# Patient Record
Sex: Female | Born: 1970 | Race: White | Hispanic: No | Smoking: Never smoker
Health system: Southern US, Community
[De-identification: ages and names within clinical notes are randomized; demographics above are authoritative.]

## PROBLEM LIST (undated history)

## (undated) DIAGNOSIS — E782 Mixed hyperlipidemia: Secondary | ICD-10-CM

## (undated) DIAGNOSIS — N939 Abnormal uterine and vaginal bleeding, unspecified: Principal | ICD-10-CM

## (undated) DIAGNOSIS — N9089 Other specified noninflammatory disorders of vulva and perineum: Secondary | ICD-10-CM

## (undated) DIAGNOSIS — IMO0002 Reserved for concepts with insufficient information to code with codable children: Secondary | ICD-10-CM

## (undated) DIAGNOSIS — N943 Premenstrual tension syndrome: Secondary | ICD-10-CM

## (undated) DIAGNOSIS — N949 Unspecified condition associated with female genital organs and menstrual cycle: Principal | ICD-10-CM

## (undated) DIAGNOSIS — N83202 Unspecified ovarian cyst, left side: Secondary | ICD-10-CM

## (undated) DIAGNOSIS — B009 Herpesviral infection, unspecified: Secondary | ICD-10-CM

## (undated) DIAGNOSIS — D649 Anemia, unspecified: Secondary | ICD-10-CM

## (undated) DIAGNOSIS — R87619 Unspecified abnormal cytological findings in specimens from cervix uteri: Secondary | ICD-10-CM

## (undated) DIAGNOSIS — M069 Rheumatoid arthritis, unspecified: Secondary | ICD-10-CM

## (undated) DIAGNOSIS — N840 Polyp of corpus uteri: Secondary | ICD-10-CM

## (undated) DIAGNOSIS — I10 Essential (primary) hypertension: Secondary | ICD-10-CM

## (undated) DIAGNOSIS — F439 Reaction to severe stress, unspecified: Secondary | ICD-10-CM

## (undated) DIAGNOSIS — G4700 Insomnia, unspecified: Secondary | ICD-10-CM

## (undated) DIAGNOSIS — R1032 Left lower quadrant pain: Secondary | ICD-10-CM

## (undated) DIAGNOSIS — R87629 Unspecified abnormal cytological findings in specimens from vagina: Secondary | ICD-10-CM

## (undated) HISTORY — DX: Left lower quadrant pain: R10.32

## (undated) HISTORY — DX: Unspecified abnormal cytological findings in specimens from cervix uteri: R87.619

## (undated) HISTORY — DX: Other specified noninflammatory disorders of vulva and perineum: N90.89

## (undated) HISTORY — DX: Abnormal uterine and vaginal bleeding, unspecified: N93.9

## (undated) HISTORY — DX: Herpesviral infection, unspecified: B00.9

## (undated) HISTORY — DX: Unspecified abnormal cytological findings in specimens from vagina: R87.629

## (undated) HISTORY — DX: Unspecified condition associated with female genital organs and menstrual cycle: N94.9

## (undated) HISTORY — DX: Essential (primary) hypertension: I10

## (undated) HISTORY — DX: Insomnia, unspecified: G47.00

## (undated) HISTORY — PX: TUBAL LIGATION: SHX77

## (undated) HISTORY — DX: Polyp of corpus uteri: N84.0

## (undated) HISTORY — DX: Mixed hyperlipidemia: E78.2

## (undated) HISTORY — DX: Reserved for concepts with insufficient information to code with codable children: IMO0002

## (undated) HISTORY — DX: Premenstrual tension syndrome: N94.3

## (undated) HISTORY — DX: Unspecified ovarian cyst, left side: N83.202

## (undated) HISTORY — DX: Rheumatoid arthritis, unspecified: M06.9

## (undated) HISTORY — DX: Anemia, unspecified: D64.9

## (undated) HISTORY — DX: Reaction to severe stress, unspecified: F43.9

---

## 2004-11-03 ENCOUNTER — Ambulatory Visit (HOSPITAL_COMMUNITY): Admission: RE | Admit: 2004-11-03 | Discharge: 2004-11-03 | Payer: Self-pay | Admitting: Family Medicine

## 2013-01-28 ENCOUNTER — Ambulatory Visit (INDEPENDENT_AMBULATORY_CARE_PROVIDER_SITE_OTHER): Payer: BC Managed Care – PPO | Admitting: Adult Health

## 2013-01-28 ENCOUNTER — Encounter: Payer: Self-pay | Admitting: Adult Health

## 2013-01-28 VITALS — BP 128/78 | Ht 64.0 in | Wt 152.0 lb

## 2013-01-28 DIAGNOSIS — N9089 Other specified noninflammatory disorders of vulva and perineum: Secondary | ICD-10-CM

## 2013-01-28 HISTORY — DX: Other specified noninflammatory disorders of vulva and perineum: N90.89

## 2013-01-28 NOTE — Patient Instructions (Signed)
Follow up in 10 days for pap and physical and labs fasting

## 2013-01-28 NOTE — Progress Notes (Signed)
Subjective:     Patient ID: Becky Henry, female   DOB: 10/02/1970, 42 y.o.   MRN: [id number]  HPI Faline is a 42 year old white female in complaining of an area left labia that has been there about a week and is painful at times, no known injury has had unprotected sex.Last pap about 8 years ago.  Review of Systems See HPI Reviewed past medical,surgical, social and family history. Reviewed medications and allergies.     Objective:   Physical Exam BP 128/78  Ht 5\' 4"  (1.626 m)  Wt 152 lb (68.947 kg)  BMI 26.08 kg/m2  LMP 01/24/2013   Skin warm and dry. External genitalia is normal in appearance, except for a linear area left labia, is red and the skin is broken, HSV culture obtained with Q tip and area tender when rubbed with Q tip, explained may not have enough secretions to culture but can check blood antibodies for HSV 1&2.  Assessment:     Labial lesion r/o HSV    Plan:     Follow up in 10 days for pap and physical and fasting labs

## 2013-01-28 NOTE — Assessment & Plan Note (Signed)
Has linear line left labia will culture HSV and check blood for antibodies

## 2013-01-29 ENCOUNTER — Telehealth: Payer: Self-pay | Admitting: Adult Health

## 2013-01-29 LAB — HSV 2 ANTIBODY, IGG: HSV 2 Glycoprotein G Ab, IgG: 5.62 IV — ABNORMAL HIGH

## 2013-01-29 NOTE — Telephone Encounter (Signed)
Left message to call.

## 2013-01-30 LAB — HERPES SIMPLEX VIRUS CULTURE: Organism ID, Bacteria: DETECTED

## 2013-02-03 ENCOUNTER — Telehealth: Payer: Self-pay | Admitting: Adult Health

## 2013-02-03 ENCOUNTER — Telehealth: Payer: Self-pay | Admitting: Obstetrics and Gynecology

## 2013-02-03 MED ORDER — VALACYCLOVIR HCL 1 G PO TABS: ORAL_TABLET | ORAL | Status: DC

## 2013-02-03 NOTE — Telephone Encounter (Signed)
Busy, has appt 10/24 if unable to reach will talk then

## 2013-02-03 NOTE — Telephone Encounter (Signed)
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 Pt to keep her appt this Friday.

## 2013-02-04 ENCOUNTER — Telehealth: Payer: Self-pay

## 2013-02-04 NOTE — Telephone Encounter (Signed)
No answer

## 2013-02-07 ENCOUNTER — Other Ambulatory Visit (HOSPITAL_COMMUNITY)
Admission: RE | Admit: 2013-02-07 | Discharge: 2013-02-07 | Disposition: A | Discharge status: Home / Self Care | Payer: BC Managed Care – PPO | Source: Ambulatory Visit | Attending: Adult Health | Admitting: Adult Health

## 2013-02-07 ENCOUNTER — Ambulatory Visit (INDEPENDENT_AMBULATORY_CARE_PROVIDER_SITE_OTHER): Payer: BC Managed Care – PPO | Admitting: Adult Health

## 2013-02-07 ENCOUNTER — Encounter: Payer: Self-pay | Admitting: Adult Health

## 2013-02-07 ENCOUNTER — Other Ambulatory Visit: Payer: BC Managed Care – PPO

## 2013-02-07 VITALS — BP 134/80 | HR 80 | Ht 64.0 in | Wt 152.0 lb

## 2013-02-07 DIAGNOSIS — Z0000 Encounter for general adult medical examination without abnormal findings: Secondary | ICD-10-CM

## 2013-02-07 DIAGNOSIS — Z1329 Encounter for screening for other suspected endocrine disorder: Secondary | ICD-10-CM

## 2013-02-07 DIAGNOSIS — Z13220 Encounter for screening for lipoid disorders: Secondary | ICD-10-CM

## 2013-02-07 DIAGNOSIS — Z01419 Encounter for gynecological examination (general) (routine) without abnormal findings: Secondary | ICD-10-CM | POA: Insufficient documentation

## 2013-02-07 DIAGNOSIS — Z113 Encounter for screening for infections with a predominantly sexual mode of transmission: Secondary | ICD-10-CM

## 2013-02-07 DIAGNOSIS — Z1151 Encounter for screening for human papillomavirus (HPV): Secondary | ICD-10-CM | POA: Insufficient documentation

## 2013-02-07 DIAGNOSIS — Z1212 Encounter for screening for malignant neoplasm of rectum: Secondary | ICD-10-CM

## 2013-02-07 LAB — COMPREHENSIVE METABOLIC PANEL
ALT: 9 U/L (ref 0–35)
Chloride: 102 mEq/L (ref 96–112)
Creat: 0.84 mg/dL (ref 0.50–1.10)
Glucose, Bld: 92 mg/dL (ref 70–99)
Total Protein: 7 g/dL (ref 6.0–8.3)

## 2013-02-07 LAB — CBC
HCT: 38.6 % (ref 36.0–46.0)
MCH: 28.8 pg (ref 26.0–34.0)
MCHC: 33.7 g/dL (ref 30.0–36.0)
MCV: 85.6 fL (ref 78.0–100.0)
Platelets: 242 10*3/uL (ref 150–400)
RDW: 14.2 % (ref 11.5–15.5)
WBC: 5.4 10*3/uL (ref 4.0–10.5)

## 2013-02-07 LAB — LIPID PANEL
Cholesterol: 159 mg/dL (ref 0–200)
LDL Cholesterol: 84 mg/dL (ref 0–99)
Triglycerides: 108 mg/dL (ref <150)

## 2013-02-07 LAB — TSH: TSH: 0.745 u[IU]/mL (ref 0.350–4.500)

## 2013-02-07 LAB — HEMOCCULT GUIAC POC 1CARD (OFFICE)

## 2013-02-07 NOTE — Progress Notes (Signed)
Patient ID: Becky Henry, female   DOB: 1971-03-10, 42 y.o.   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   Current Medications, Allergies, Past Medical History, Past Surgical History, Family History and Social History were reviewed in [employer] record.     Review of Systems: Patient denies any headaches, blurred vision, shortness of breath, chest pain, abdominal pain, problems with bowel movements, urination, or intercourse. No joint pain or mood swings, periods are regular.Area on labia resolved.    Physical Exam:BP 134/80  Pulse 80  Ht 5\' 4"  (1.626 m)  Wt 152 lb (68.947 kg)  BMI 26.08 kg/m2  LMP 01/24/2013 General:  Well developed, well nourished, no acute distress Skin:  Warm and dry, tan Neck:  Midline trachea, normal thyroid Lungs; Clear to auscultation bilaterally Breast:  No dominant palpable mass, retraction, or nipple discharge Cardiovascular: Regular rate and rhythm Abdomen:  Soft, non tender, no hepatosplenomegaly Pelvic:  External genitalia is normal in appearance.Has clit ring.  The vagina is normal in appearance. The cervix is bulbous and friable with EC brush, pap performed with HPV and a GC/CHL was obtained.  Uterus is felt to be normal size, shape, and contour.  No  adnexal masses or tenderness noted. Rectal: Good sphincter tone, no polyps, or hemorrhoids felt.  Hemoccult negative. Extremities:  No swelling or varicosities noted Psych:  Alert and cooperative, a little teary with HSV discussion, transmission and shedding discussed.   Impression: Yearly exam STD screening Recent HSV diagnosis    Plan: Physical in 1 year Mammogram yearly  Had fasting labs this before visit(CBC,CMP,TSH and lipids) Will talk next week Review handouts on herpes

## 2013-02-07 NOTE — Patient Instructions (Signed)
Herpes Labialis You have a fever blister or cold sore (herpes labialis). These painful, grouped sores are caused by one of the herpes viruses (HSV1 most commonly). They are usually found around the lips and mouth, but the same infection can also affect other areas on the face such as the nose and eyes. Herpes infections take about 10 days to heal. They often occur again and again in the same spot. Other symptoms may include numbness and tingling in the involved skin, achiness, fever, and swollen glands in the neck. Colds, emotional stress, injuries, or excess sunlight exposure all seem to make herpes reappear. Herpes lip infections are contagious. Direct contact with these sores can spread the infection. It can also be spread to other parts of your own body. TREATMENT  Herpes labialis is usually self-limited and resolves within 1 week. To reduce pain and swelling, apply ice packs frequently to the sores or suck on popsicles or frozen juice bars. Antiviral medicine may be used by mouth to shorten the duration of the breakout. Avoid spreading the infection by washing your hands often. Be careful not to touch your eyes or genital areas after handling the infected blisters. Do not kiss or have other intimate contact with others. After the blisters are completely healed you may resume contact. Use sunscreen to lessen recurrences.  If this is your first infection with herpes, or if you have a severe or repeated infections, your caregiver may prescribe one of the anti-viral drugs to speed up the healing. If you have sun-related flare-ups despite the use of sunscreen, starting oral anti-viral medicine before a prolonged exposure (going skiing or to the beach) can prevent most episodes.  SEEK IMMEDIATE MEDICAL CARE IF:  You develop a headache, sleepiness, high fever, vomiting, or severe weakness.  You have eye irritation, pain, blurred vision or redness.  You develop a prolonged infection not getting better in 10  days. Document Released: 04/03/2005 Document Revised: 06/26/2011 Document Reviewed: 02/05/2009 [facility] Patient Information 2014 [place], [place]. Physical in 1 year Mammogram yearly Talk next week about  labs

## 2013-02-07 NOTE — Assessment & Plan Note (Signed)
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

## 2013-02-10 ENCOUNTER — Telehealth: Payer: Self-pay | Admitting: Adult Health

## 2013-02-10 NOTE — Telephone Encounter (Signed)
busy

## 2013-02-20 ENCOUNTER — Telehealth: Payer: Self-pay | Admitting: Adult Health

## 2013-02-20 ENCOUNTER — Other Ambulatory Visit: Payer: Self-pay

## 2013-02-20 NOTE — Telephone Encounter (Signed)
Pt informed of WNL results from 02/07/2013 including Pap, GC/CHL, TSH. Lipid profile, CMP, CBC.

## 2013-03-04 ENCOUNTER — Telehealth: Payer: Self-pay | Admitting: Adult Health

## 2013-03-05 MED ORDER — VALACYCLOVIR HCL 1 G PO TABS: ORAL_TABLET | ORAL | Status: DC

## 2013-03-05 NOTE — Telephone Encounter (Signed)
Will rx valtrex 1gm 1 daily

## 2013-03-20 ENCOUNTER — Telehealth: Payer: Self-pay | Admitting: Adult Health

## 2013-03-20 NOTE — Telephone Encounter (Signed)
Pt states needs RX for valtrex called to pharmacy. Called RX for valtrex 1000 mg 1 po tablet daily, PRN refill per written order by Cyril Mourning, NP. Pt aware.

## 2013-08-26 ENCOUNTER — Encounter: Payer: Self-pay | Admitting: Adult Health

## 2013-08-26 ENCOUNTER — Other Ambulatory Visit (INDEPENDENT_AMBULATORY_CARE_PROVIDER_SITE_OTHER): Payer: BC Managed Care – PPO

## 2013-08-26 ENCOUNTER — Other Ambulatory Visit: Payer: Self-pay | Admitting: Adult Health

## 2013-08-26 ENCOUNTER — Ambulatory Visit (INDEPENDENT_AMBULATORY_CARE_PROVIDER_SITE_OTHER): Payer: BC Managed Care – PPO | Admitting: Adult Health

## 2013-08-26 VITALS — BP 140/90 | Ht 63.0 in | Wt 142.4 lb

## 2013-08-26 DIAGNOSIS — N83209 Unspecified ovarian cyst, unspecified side: Secondary | ICD-10-CM

## 2013-08-26 DIAGNOSIS — N949 Unspecified condition associated with female genital organs and menstrual cycle: Secondary | ICD-10-CM

## 2013-08-26 DIAGNOSIS — Z3202 Encounter for pregnancy test, result negative: Secondary | ICD-10-CM

## 2013-08-26 DIAGNOSIS — N898 Other specified noninflammatory disorders of vagina: Secondary | ICD-10-CM

## 2013-08-26 DIAGNOSIS — N840 Polyp of corpus uteri: Secondary | ICD-10-CM

## 2013-08-26 DIAGNOSIS — N83202 Unspecified ovarian cyst, left side: Secondary | ICD-10-CM

## 2013-08-26 HISTORY — DX: Polyp of corpus uteri: N84.0

## 2013-08-26 HISTORY — DX: Unspecified condition associated with female genital organs and menstrual cycle: N94.9

## 2013-08-26 HISTORY — DX: Unspecified ovarian cyst, left side: N83.202

## 2013-08-26 LAB — POCT URINALYSIS DIPSTICK
LEUKOCYTES UA: NEGATIVE
Nitrite, UA: NEGATIVE
Protein, UA: NEGATIVE

## 2013-08-26 LAB — POCT WET PREP (WET MOUNT)
TRICHOMONAS WET PREP HPF POC: NEGATIVE
WBC, Wet Prep HPF POC: POSITIVE

## 2013-08-26 LAB — POCT URINE PREGNANCY: Preg Test, Ur: NEGATIVE

## 2013-08-26 MED ORDER — CIPROFLOXACIN HCL 500 MG PO TABS: 500.0000 mg | ORAL_TABLET | Freq: Two times a day (BID) | ORAL | Status: DC

## 2013-08-26 MED ORDER — DOXYCYCLINE MONOHYDRATE 100 MG PO CAPS: 100.0000 mg | ORAL_CAPSULE | Freq: Two times a day (BID) | ORAL | Status: DC

## 2013-08-26 MED ORDER — PROMETHAZINE HCL 25 MG PO TABS: 25.0000 mg | ORAL_TABLET | Freq: Four times a day (QID) | ORAL | Status: DC | PRN

## 2013-08-26 NOTE — Progress Notes (Addendum)
Subjective:     Patient ID: Becky Henry, female   DOB: [birth year], 43 y.o.   MRN: [id number]  HPI Becky Henry is a 43 year old white female in complaining of having had fever Saturday then nausea and vomiting, was seen Monday at Urgent Care in [place], [place].And found to have hemorrhagic ovarian cyst, and was given pain meds only.She had pain after BM and had to lay in floor, due to pain in left side.  Review of Systems See HPI Reviewed past medical,surgical, social and family history. Reviewed medications and allergies.     Objective:   Physical Exam BP 140/90  Ht _0  (1.6 m)  Wt 142 lb 6.4 oz (64.592 kg)  BMI 25.23 kg/m2  LMP 04/15/2015Urine trace blood, UPT negative,she appears to be uncomfortable.   Skin warm and dry.Pelvic: external genitalia is normal in appearance, vagina: scant discharge without odor, cervix:smooth and bulbous,negative CMT, uterus: normal size, shape and contour,  tender, no masses felt, adnexa: no masses, has tenderness LLQ and down left leg, No CVAT, or rebound,but looks uncomfortable.US obtained.US showed endometrial polyp, and left ovarian cyst and left para tubal cyst.Discussed with Dr Elonda Husky and he agrees with pal of treatment.Discussed  With pt. The need for CT to assess bowels and kidneys. GC/CHL obtained.   Assessment:     Pelvic pain Left ovarian cyst Endometrial polyp    Plan:    Check CBC,CMP,ESR and GC/CHL Rx Cipro 500 mg #20 1 bid x 10 days Rx doxycyline 100 mg #20 1 bid x 10 days Rx Phenergan 25 mg #30 1 every 6 hours prn with 1 refill Take hydrocodone if needed CT abd and pelvis with and without contrast 5/13 at 5:30 pm at [facility] If pain increases go to ER  Review handout on pelvic pain Follow here with me in 48 hours   Pt has note to return to work 09/01/13.

## 2013-08-26 NOTE — Patient Instructions (Addendum)
[facility] at [facility]30 pm Follow up here in 48 hours Pelvic Pain, Female Female pelvic pain can be caused by many different things and start from a variety of places. Pelvic pain refers to pain that is located in the lower half of the abdomen and between your hips. The pain may occur over a short period of time (acute) or may be reoccurring (chronic). The cause of pelvic pain may be related to disorders affecting the female reproductive organs (gynecologic), but it may also be related to the bladder, kidney stones, an intestinal complication, or muscle or skeletal problems. Getting help right away for pelvic pain is important, especially if there has been severe, sharp, or a sudden onset of unusual pain. It is also important to get help right away because some types of pelvic pain can be life threatening.  CAUSES  Below are only some of the causes of pelvic pain. The causes of pelvic pain can be in one of several categories.   Gynecologic.  Pelvic inflammatory disease.  Sexually transmitted infection.  Ovarian cyst or a twisted ovarian ligament (ovarian torsion).  Uterine lining that grows outside the uterus (endometriosis).  Fibroids, cysts, or tumors.  Ovulation.  Pregnancy.  Pregnancy that occurs outside the uterus (ectopic pregnancy).  Miscarriage.  Labor.  Abruption of the placenta or ruptured uterus.  Infection.  Uterine infection (endometritis).  Bladder infection.  Diverticulitis.  Miscarriage related to a uterine infection (septic abortion).  Bladder.  Inflammation of the bladder (cystitis).  Kidney stone(s).  Gastrointenstinal.  Constipation.  Diverticulitis.  Neurologic.  Trauma.  Feeling pelvic pain because of mental or emotional causes (psychosomatic).  Cancers of the bowel or pelvis. EVALUATION  Your caregiver will want to take a careful history of your concerns. This includes recent changes in your health,  a careful gynecologic history of your periods (menses), and a sexual history. Obtaining your family history and medical history is also important. Your caregiver may suggest a pelvic exam. A pelvic exam will help identify the location and severity of the pain. It also helps in the evaluation of which organ system may be involved. In order to identify the cause of the pelvic pain and be properly treated, your caregiver may order tests. These tests may include:   A pregnancy test.  Pelvic ultrasonography.  An X-ray exam of the abdomen.  A urinalysis or evaluation of vaginal discharge.  Blood tests. HOME CARE INSTRUCTIONS   Only take over-the-counter or prescription medicines for pain, discomfort, or fever as directed by your caregiver.   Rest as directed by your caregiver.   Eat a balanced diet.   Drink enough fluids to make your urine clear or pale yellow, or as directed.   Avoid sexual intercourse if it causes pain.   Apply warm or cold compresses to the lower abdomen depending on which one helps the pain.   Avoid stressful situations.   Keep a journal of your pelvic pain. Write down when it started, where the pain is located, and if there are things that seem to be associated with the pain, such as food or your menstrual cycle.  Follow up with your caregiver as directed.  SEEK MEDICAL CARE IF:  Your medicine does not help your pain.  You have abnormal vaginal discharge. SEEK IMMEDIATE MEDICAL CARE IF:   You have heavy bleeding from the vagina.   Your pelvic pain increases.   You feel lightheaded or faint.  You have chills.   You have pain with urination or blood in your urine.   You have uncontrolled diarrhea or vomiting.   You have a fever or persistent symptoms for more than 3 days.  You have a fever and your symptoms suddenly get worse.   You are being physically or sexually abused.  MAKE SURE YOU:  Understand these instructions.  Will  watch your condition.  Will get help if you are not doing well or get worse. Document Released: 02/29/2004 Document Revised: 10/03/2011 Document Reviewed: 07/24/2011 [facility] Patient Information 2014 [place], [place].

## 2013-08-27 ENCOUNTER — Ambulatory Visit (HOSPITAL_COMMUNITY)
Admission: RE | Admit: 2013-08-27 | Discharge: 2013-08-27 | Disposition: A | Discharge status: Home / Self Care | Payer: BC Managed Care – PPO | Source: Ambulatory Visit | Attending: Adult Health | Admitting: Adult Health

## 2013-08-27 DIAGNOSIS — R109 Unspecified abdominal pain: Secondary | ICD-10-CM | POA: Insufficient documentation

## 2013-08-27 DIAGNOSIS — N949 Unspecified condition associated with female genital organs and menstrual cycle: Secondary | ICD-10-CM

## 2013-08-27 DIAGNOSIS — R110 Nausea: Secondary | ICD-10-CM | POA: Insufficient documentation

## 2013-08-27 LAB — CBC
HCT: 41.8 % (ref 36.0–46.0)
HEMOGLOBIN: 14.7 g/dL (ref 12.0–15.0)
MCH: 31.1 pg (ref 26.0–34.0)
MCHC: 35.2 g/dL (ref 30.0–36.0)
MCV: 88.6 fL (ref 78.0–100.0)
Platelets: 215 10*3/uL (ref 150–400)
RBC: 4.72 MIL/uL (ref 3.87–5.11)
RDW: 14 % (ref 11.5–15.5)
WBC: 5.3 10*3/uL (ref 4.0–10.5)

## 2013-08-27 LAB — COMPREHENSIVE METABOLIC PANEL
ALBUMIN: 4.6 g/dL (ref 3.5–5.2)
ALK PHOS: 66 U/L (ref 39–117)
ALT: 25 U/L (ref 0–35)
AST: 25 U/L (ref 0–37)
BUN: 9 mg/dL (ref 6–23)
CHLORIDE: 100 meq/L (ref 96–112)
CO2: 21 meq/L (ref 19–32)
CREATININE: 0.76 mg/dL (ref 0.50–1.10)
Calcium: 9.3 mg/dL (ref 8.4–10.5)
GLUCOSE: 77 mg/dL (ref 70–99)
POTASSIUM: 3.4 meq/L — AB (ref 3.5–5.3)
Sodium: 133 mEq/L — ABNORMAL LOW (ref 135–145)
Total Bilirubin: 0.5 mg/dL (ref 0.2–1.2)
Total Protein: 7.4 g/dL (ref 6.0–8.3)

## 2013-08-27 LAB — GC/CHLAMYDIA PROBE AMP
CT Probe RNA: NEGATIVE
GC Probe RNA: NEGATIVE

## 2013-08-27 LAB — SEDIMENTATION RATE: Sed Rate: 17 mm/hr (ref 0–22)

## 2013-08-27 MED ORDER — IOHEXOL 300 MG/ML  SOLN
100.0000 mL | Freq: Once | INTRAMUSCULAR | Status: AC | PRN
  Administered 2013-08-27: 100 mL via INTRAVENOUS

## 2013-08-28 ENCOUNTER — Ambulatory Visit: Payer: BC Managed Care – PPO | Admitting: Adult Health

## 2013-08-29 ENCOUNTER — Ambulatory Visit (INDEPENDENT_AMBULATORY_CARE_PROVIDER_SITE_OTHER): Payer: BC Managed Care – PPO | Admitting: Adult Health

## 2013-08-29 ENCOUNTER — Encounter: Payer: Self-pay | Admitting: Adult Health

## 2013-08-29 VITALS — BP 124/80 | Ht 63.0 in | Wt 148.5 lb

## 2013-08-29 DIAGNOSIS — N83202 Unspecified ovarian cyst, left side: Secondary | ICD-10-CM

## 2013-08-29 DIAGNOSIS — N949 Unspecified condition associated with female genital organs and menstrual cycle: Secondary | ICD-10-CM

## 2013-08-29 DIAGNOSIS — N83209 Unspecified ovarian cyst, unspecified side: Secondary | ICD-10-CM

## 2013-08-29 DIAGNOSIS — N840 Polyp of corpus uteri: Secondary | ICD-10-CM

## 2013-08-29 NOTE — Progress Notes (Signed)
Subjective:     Patient ID: Becky Henry, female   DOB: 27-May-1970, 43 y.o.   MRN: [id number]  HPI Becky Henry is a 43 year old white female back in follow up of pelvic pain, which is much better.  Review of Systems See HPI Reviewed past medical,surgical, social and family history. Reviewed medications and allergies.     Objective:   Physical Exam BP 124/80  Ht 5' 3"  (1.6 m)  Wt 148 lb 8 oz (67.359 kg)  BMI 26.31 kg/m2  LMP 08/04/2013   Feels much better, reviewed labs and CT with pt.CT was normal except some thickening of colon ?inflammation.ESR 17,GC/CHL negative, CBC WNL. Will get F/U US after next period to assess ovary and endo polyp.  Assessment:     Pelvic pain resolving, ? Bowel related Left ovarian cyst Endometrial polyp    Plan:     Finish medications Return in 3 weeks for [place] and see me

## 2013-08-29 NOTE — Patient Instructions (Signed)
Follow up in 3 weeks for [place]

## 2013-09-16 ENCOUNTER — Other Ambulatory Visit: Payer: Self-pay | Admitting: Adult Health

## 2013-09-16 DIAGNOSIS — N840 Polyp of corpus uteri: Secondary | ICD-10-CM

## 2013-09-16 DIAGNOSIS — N949 Unspecified condition associated with female genital organs and menstrual cycle: Secondary | ICD-10-CM

## 2013-09-22 ENCOUNTER — Ambulatory Visit: Payer: BC Managed Care – PPO | Admitting: Adult Health

## 2013-09-22 ENCOUNTER — Other Ambulatory Visit: Payer: BC Managed Care – PPO

## 2013-09-25 ENCOUNTER — Ambulatory Visit (INDEPENDENT_AMBULATORY_CARE_PROVIDER_SITE_OTHER): Payer: BC Managed Care – PPO

## 2013-09-25 ENCOUNTER — Encounter: Payer: Self-pay | Admitting: Adult Health

## 2013-09-25 ENCOUNTER — Ambulatory Visit (INDEPENDENT_AMBULATORY_CARE_PROVIDER_SITE_OTHER): Payer: BC Managed Care – PPO | Admitting: Adult Health

## 2013-09-25 VITALS — BP 128/74 | Ht 63.0 in | Wt 148.0 lb

## 2013-09-25 DIAGNOSIS — N840 Polyp of corpus uteri: Secondary | ICD-10-CM

## 2013-09-25 DIAGNOSIS — N949 Unspecified condition associated with female genital organs and menstrual cycle: Secondary | ICD-10-CM

## 2013-09-25 DIAGNOSIS — G4700 Insomnia, unspecified: Secondary | ICD-10-CM

## 2013-09-25 HISTORY — DX: Insomnia, unspecified: G47.00

## 2013-09-25 MED ORDER — LORAZEPAM 0.5 MG PO TABS: ORAL_TABLET | ORAL | Status: DC

## 2013-09-25 NOTE — Progress Notes (Signed)
Subjective:     Patient ID: CASIA PLA, female   DOB: [birth year], 43 y.o.   MRN: [id number]  HPI Kailer is a 43 year old white female in for [place] to assess uterus and ovaries, has history of ovarian cyst and polyp, and had episode of pelvic pain, but it was better after finished medication.Complains of not sleeping well, has tired several OTC meds with out success.  Review of Systems See HPI Reviewed past medical,surgical, social and family history. Reviewed medications and allergies.     Objective:   Physical Exam BP 128/74  Ht 5\' 3"  (1.6 m)  Wt 148 lb (67.132 kg)  BMI 26.22 kg/m2  LMP 06/04/2015Reviewed [place] with pt.    Uterus Anteverted no myometrial masses noted  Endometrium 10.73mm hyperechoic mass noted within cavity with +Doppler flow noted (polyp)  Right ovary Ovary appears WNL  Left ovary Ovary appears WNL  No free fluid noted within the pelvis  Technician Comments:  Anteverted uterus noted with 10.2mm endometrial ?polyp noted within, bilateral ovaries appear WNL  Discussed removing polyp and ?abaltion to stop periods   Assessment:     Endometrial polyp Insomnia     Plan:    Review handout on ablation and insomnia Rx ativan 0.5mg  #15 1 at hs prn not every night Return in 2 weeks for pre op with Dr Despina Hidden for hysteroscopy D&C with polyp removal and endo ablation

## 2013-09-25 NOTE — Patient Instructions (Signed)

## 2013-10-10 ENCOUNTER — Encounter: Payer: Self-pay | Admitting: Obstetrics & Gynecology

## 2013-10-10 ENCOUNTER — Ambulatory Visit (INDEPENDENT_AMBULATORY_CARE_PROVIDER_SITE_OTHER): Payer: BC Managed Care – PPO | Admitting: Obstetrics & Gynecology

## 2013-10-10 VITALS — BP 120/80 | Ht 64.0 in | Wt 148.0 lb

## 2013-10-10 DIAGNOSIS — N840 Polyp of corpus uteri: Secondary | ICD-10-CM

## 2013-10-10 DIAGNOSIS — Z01818 Encounter for other preprocedural examination: Secondary | ICD-10-CM

## 2013-10-10 MED ORDER — LORAZEPAM 0.5 MG PO TABS: ORAL_TABLET | ORAL | Status: DC

## 2013-10-10 NOTE — Progress Notes (Signed)
Patient ID: Becky NoonMarie E Goguen, female   DOB: 1970-06-21, 43 y.o.   MRN: [id number] Preoperative History and Physical  Becky Henry is a 43 y.o. [id number] with Patient's last menstrual period was 10/06/2013. admitted for a hysterosocpy uterine curettage endometrial ablation and removal of endometrial polyp.  Sonogram reveals rather large endometrial polyp  PMH:    Past Medical History  Diagnosis Date  . Labial lesion 01/28/2013  . Abnormal Pap smear   . Anemia   . Hypertension   . Herpes simplex without mention of complication   . Vaginal Pap smear, abnormal   . Unspecified symptom associated with female genital organs 08/26/2013  . Left ovarian cyst 08/26/2013  . Endometrial polyp 08/26/2013  . Insomnia 09/25/2013    PSH:     Past Surgical History  Procedure Laterality Date  . Tubal ligation      POb/GynH:      OB History   Grav Para Term Preterm Abortions TAB SAB Ect Mult Living   5 4   1  1   4       SH:   History  Substance Use Topics  . Smoking status: Never Smoker   . Smokeless tobacco: Never Used  . Alcohol Use: No     Comment: occ.    FH:    Family History  Problem Relation Age of Onset  . Diabetes Mother   . Mental illness Mother   . Diabetes Father   . Cancer Father   . Diabetes Maternal Grandmother   . Diabetes Maternal Grandfather   . Diabetes Paternal Grandmother   . Diabetes Paternal Grandfather      Allergies:  Allergies  Allergen Reactions  . Sulfa Antibiotics Hives and Swelling    Also affected liver.    Medications:      Current outpatient prescriptions:LORazepam (ATIVAN) 0.5 MG tablet, Take 1 at hs prn not every night, Disp: 30 tablet, Rfl: 2;  valACYclovir (VALTREX) 1000 MG tablet, Take 1 po daily, Disp: 30 tablet, Rfl: prn;  ciprofloxacin (CIPRO) 500 MG tablet, Take 1 tablet (500 mg total) by mouth 2 (two) times daily., Disp: 20 tablet, Rfl: 0 doxycycline (MONODOX) 100 MG capsule, Take 1 capsule (100 mg total) by mouth 2 (two) times daily.,  Disp: 20 capsule, Rfl: 0;  HYDROcodone-acetaminophen (NORCO/VICODIN) 5-325 MG per tablet, Take 1 tablet by mouth every 6 (six) hours as needed for moderate pain., Disp: , Rfl: ;  promethazine (PHENERGAN) 25 MG tablet, Take 1 tablet (25 mg total) by mouth every 6 (six) hours as needed for nausea or vomiting., Disp: 30 tablet, Rfl: 1  Review of Systems:   Review of Systems  Constitutional: Negative for fever, chills, weight loss, malaise/fatigue and diaphoresis.  HENT: Negative for hearing loss, ear pain, nosebleeds, congestion, sore throat, neck pain, tinnitus and ear discharge.   Eyes: Negative for blurred vision, double vision, photophobia, pain, discharge and redness.  Respiratory: Negative for cough, hemoptysis, sputum production, shortness of breath, wheezing and stridor.   Cardiovascular: Negative for chest pain, palpitations, orthopnea, claudication, leg swelling and PND.  Gastrointestinal: Positive for abdominal pain. Negative for heartburn, nausea, vomiting, diarrhea, constipation, blood in stool and melena.  Genitourinary: Negative for dysuria, urgency, frequency, hematuria and flank pain.  Musculoskeletal: Negative for myalgias, back pain, joint pain and falls.  Skin: Negative for itching and rash.  Neurological: Negative for dizziness, tingling, tremors, sensory change, speech change, focal weakness, seizures, loss of consciousness, weakness and headaches.  Endo/Heme/Allergies: Negative for environmental allergies  and polydipsia. Does not bruise/bleed easily.  Psychiatric/Behavioral: Negative for depression, suicidal ideas, hallucinations, memory loss and substance abuse. The patient is not nervous/anxious and does not have insomnia.      PHYSICAL EXAM:  Blood pressure 120/80, height 5\' 4"  (1.626 m), weight 148 lb (67.132 kg), last menstrual period 10/06/2013.    Vitals reviewed. Constitutional: She is oriented to person, place, and time. She appears well-developed and  well-nourished.  HENT:  Head: Normocephalic and atraumatic.  Right Ear: External ear normal.  Left Ear: External ear normal.  Nose: Nose normal.  Mouth/Throat: Oropharynx is clear and moist.  Eyes: Conjunctivae and EOM are normal. Pupils are equal, round, and reactive to light. Right eye exhibits no discharge. Left eye exhibits no discharge. No scleral icterus.  Neck: Normal range of motion. Neck supple. No tracheal deviation present. No thyromegaly present.  Cardiovascular: Normal rate, regular rhythm, normal heart sounds and intact distal pulses.  Exam reveals no gallop and no friction rub.   No murmur heard. Respiratory: Effort normal and breath sounds normal. No respiratory distress. She has no wheezes. She has no rales. She exhibits no tenderness.  GI: Soft. Bowel sounds are normal. She exhibits no distension and no mass. There is tenderness. There is no rebound and no guarding.  Genitourinary:       Vulva is normal without lesions Vagina is pink moist without discharge Cervix normal in appearance and pap is normal Uterus is normal Adnexa is negative with normal sized ovaries by sonogram  Musculoskeletal: Normal range of motion. She exhibits no edema and no tenderness.  Neurological: She is alert and oriented to person, place, and time. She has normal reflexes. She displays normal reflexes. No cranial nerve deficit. She exhibits normal muscle tone. Coordination normal.  Skin: Skin is warm and dry. No rash noted. No erythema. No pallor.  Psychiatric: She has a normal mood and affect. Her behavior is normal. Judgment and thought content normal.    Labs: No results found for this or any previous visit (from the past 336 hour(s)).  EKG: No orders found for this or any previous visit.  Imaging Studies: US Pelvis Limited  10/01/2013   GYNECOLOGIC SONOGRAM   Becky Henry is a 43 y.o. [id number] LMP 09/18/2013 for a pelvic sonogram for  follow up ?endometrial polyp per previous u/s.  Uterus                        Anteverted no myometrial masses noted   Endometrium          10.106mm hyperechoic mass noted within cavity with  +Doppler flow noted (polyp)  Right ovary             Ovary appears WNL  Left ovary                Ovary appears WNL  No free fluid noted within the pelvis  Technician Comments:  Anteverted uterus noted with 10.20mm endometrial ?polyp noted within,  bilateral ovaries appear WNL   Becky Henry 09/25/2013 3:40 PM Clinical Impression and recommendations:  I have reviewed the sonogram results above.  Combined with the patient's current clinical course, below are my  impressions and any appropriate recommendations for management based on  the sonographic findings:  1. Persistent presence of 10.6 mm endometrial polyp 2. Tissue sampling advised, by biopsy or probable hysteroscopic resection.  Becky Henry,Becky Henry       Assessment: Patient Active Problem List   Diagnosis  Date Noted  . Insomnia 09/25/2013  . Unspecified symptom associated with female genital organs 08/26/2013  . Left ovarian cyst 08/26/2013  . Endometrial polyp 08/26/2013  . Labial lesion 01/28/2013    Plan: Hysteroscopy uterine curettage endometrial ablation with removal of endometrial polyp  Becky Henry,Becky Henry 10/10/2013 12:40 PM

## 2013-10-20 NOTE — Patient Instructions (Signed)
Becky Henry  10/20/2013   Your procedure is scheduled on:  10/24/2013  Report to [facility] at  615  AM.  Call this number if you have problems the morning of surgery: [phone]   Remember:   Do not eat food or drink liquids after midnight.   Take these medicines the morning of surgery with A SIP OF WATER:  Hydrocodone, lorazepam, phenergan   Do not wear jewelry, make-up or nail polish.  Do not wear lotions, powders, or perfumes.   Do not shave 48 hours prior to surgery. Men may shave face and neck.  Do not bring valuables to the hospital.  [facility] is not responsible for any belongings or valuables.               Contacts, dentures or bridgework may not be worn into surgery.  Leave suitcase in the car. After surgery it may be brought to your room.  For patients admitted to the hospital, discharge time is determined by your treatment team.               Patients discharged the day of surgery will not be allowed to drive home.  Name and phone number of your driver: family  Special Instructions: Shower using CHG 2 nights before surgery and the night before surgery.  If you shower the day of surgery use CHG.  Use special wash - you have one bottle of CHG for all showers.  You should use approximately 1/3 of the bottle for each shower.   Please read over the following fact sheets that you were given: Pain Booklet, Coughing and Deep Breathing, Surgical Site Infection Prevention, Anesthesia Post-op Instructions and Care and Recovery After Surgery Endometrial Ablation Endometrial ablation removes the lining of the uterus (endometrium). It is usually a same-day, outpatient treatment. Ablation helps avoid major surgery, such as surgery to remove the cervix and uterus (hysterectomy). After endometrial ablation, you will have little or no menstrual bleeding and may not be able to have children. However, if you are premenopausal, you will need to use a reliable method of birth  control following the procedure because of the small chance that pregnancy can occur. There are different reasons to have this procedure, which include:  Heavy periods.  Bleeding that is causing anemia.  Irregular bleeding.  Bleeding fibroids on the lining inside the uterus if they are smaller than 3 centimeters. This procedure should not be done if:  You want children in the future.  You have severe cramps with your menstrual period.  You have precancerous or cancerous cells in your uterus.  You were recently pregnant.  You have gone through menopause.  You have had major surgery on the uterus, such as a cesarean delivery. LET [facility] PROVIDER KNOW ABOUT:  Any allergies you have.  All medicines you are taking, including vitamins, herbs, eye drops, creams, and over-the-counter medicines.  Previous problems you or members of your family have had with the use of anesthetics.  Any blood disorders you have.  Previous surgeries you have had.  Medical conditions you have. RISKS AND COMPLICATIONS  Generally, this is a safe procedure. However, as with any procedure, complications can occur. Possible complications include:  Perforation of the uterus.  Bleeding.  Infection of the uterus, bladder, or vagina.  Injury to surrounding organs.  An air bubble to the lung (air embolus).  Pregnancy following the procedure.  Failure of the procedure  to help the problem, requiring hysterectomy.  Decreased ability to diagnose cancer in the lining of the uterus. BEFORE THE PROCEDURE  The lining of the uterus must be tested to make sure there is no pre-cancerous or cancer cells present.  An ultrasound may be performed to look at the size of the uterus and to check for abnormalities.  Medicines may be given to thin the lining of the uterus. PROCEDURE  During the procedure, your health care provider will use a tool called a resectoscope to help see inside your uterus.  There are different ways to remove the lining of your uterus.   Radiofrequency - This method uses a radiofrequency-alternating electric current to remove the lining of the uterus.  Cryotherapy - This method uses extreme cold to freeze the lining of the uterus.  Heated-Free Liquid - This method uses heated salt (saline) solution to remove the lining of the uterus.  Microwave - This method uses high-energy microwaves to heat up the lining of the uterus to remove it.  Thermal balloon - This method involves inserting a catheter with a balloon tip into the uterus. The balloon tip is filled with heated fluid to remove the lining of the uterus. AFTER THE PROCEDURE  After your procedure, do not have sexual intercourse or insert anything into your vagina until permitted by your health care provider. After the procedure, you may experience:  Cramps.  Vaginal discharge.  Frequent urination. Document Released: 02/11/2004 Document Revised: 12/04/2012 Document Reviewed: 09/04/2012 [facility] Patient Information 2015 [employer] [place]. This information is not intended to replace advice given to you by your health care provider. Make sure you discuss any questions you have with your health care provider. Hysteroscopy Hysteroscopy is a procedure used for looking inside the womb (uterus). It may be done for various reasons, including:  To evaluate abnormal bleeding, fibroid (benign, noncancerous) tumors, polyps, scar tissue (adhesions), and possibly cancer of the uterus.  To look for lumps (tumors) and other uterine growths.  To look for causes of why a woman cannot get pregnant (infertility), causes of recurrent loss of pregnancy (miscarriages), or a lost intrauterine device (IUD).  To perform a sterilization by blocking the fallopian tubes from inside the uterus. In this procedure, a thin, flexible tube with a tiny light and camera on the end of it (hysteroscope) is used to look inside the uterus. A  hysteroscopy should be done right after a menstrual period to be sure you are not pregnant. LET [facility] KNOW ABOUT:   Any allergies you have.  All medicines you are taking, including vitamins, herbs, eye drops, creams, and over-the-counter medicines.  Previous problems you or members of your family have had with the use of anesthetics.  Any blood disorders you have.  Previous surgeries you have had.  Medical conditions you have. RISKS AND COMPLICATIONS  Generally, this is a safe procedure. However, as with any procedure, complications can occur. Possible complications include:  Putting a hole in the uterus.  Excessive bleeding.  Infection.  Damage to the cervix.  Injury to other organs.  Allergic reaction to medicines.  Too much fluid used in the uterus for the procedure. BEFORE THE PROCEDURE   Ask your health care provider about changing or stopping any regular medicines.  Do not take aspirin or blood thinners for 1 week before the procedure, or as directed by your health care provider. These can cause bleeding.  If you smoke, do not smoke for 2 weeks before the procedure.  In some cases, a medicine is placed in the cervix the day before the procedure. This medicine makes the cervix have a larger opening (dilate). This makes it easier for the instrument to be inserted into the uterus during the procedure.  Do not eat or drink anything for at least 8 hours before the surgery.  Arrange for someone to take you home after the procedure. PROCEDURE   You may be given a medicine to relax you (sedative). You may also be given one of the following:  A medicine that numbs the area around the cervix (local anesthetic).  A medicine that makes you sleep through the procedure (general anesthetic).  The hysteroscope is inserted through the vagina into the uterus. The camera on the hysteroscope sends a picture to a TV screen. This gives the surgeon a good view  inside the uterus.  During the procedure, air or a liquid is put into the uterus, which allows the surgeon to see better.  Sometimes, tissue is gently scraped from inside the uterus. These tissue samples are sent to a lab for testing. AFTER THE PROCEDURE   If you had a general anesthetic, you may be groggy for a couple hours after the procedure.  If you had a local anesthetic, you will be able to go home as soon as you are stable and feel ready.  You may have some cramping. This normally lasts for a couple days.  You may have bleeding, which varies from light spotting for a few days to menstrual-like bleeding for 3-7 days. This is normal.  If your test results are not back during the visit, make an appointment with your health care provider to find out the results. Document Released: 07/10/2000 Document Revised: 01/22/2013 Document Reviewed: 10/31/2012 [facility] Patient Information 2015 [place], [place]. This information is not intended to replace advice given to you by your health care provider. Make sure you discuss any questions you have with your health care provider. Dilation and Curettage or Vacuum Curettage Dilation and curettage (D&C) and vacuum curettage are minor procedures. A D&C involves stretching (dilation) the cervix and scraping (curettage) the inside lining of the womb (uterus). During a D&C, tissue is gently scraped from the inside lining of the uterus. During a vacuum curettage, the lining and tissue in the uterus are removed with the use of gentle suction.  Curettage may be performed to either diagnose or treat a problem. As a diagnostic procedure, curettage is performed to examine tissues from the uterus. A diagnostic curettage may be performed for the following symptoms:   Irregular bleeding in the uterus.   Bleeding with the development of clots.   Spotting between menstrual periods.   Prolonged menstrual periods.   Bleeding after menopause.   No menstrual  period (amenorrhea).   A change in size and shape of the uterus.  As a treatment procedure, curettage may be performed for the following reasons:   Removal of an IUD (intrauterine device).   Removal of retained placenta after giving birth. Retained placenta can cause an infection or bleeding severe enough to require transfusions.   Abortion.   Miscarriage.   Removal of polyps inside the uterus.   Removal of uncommon types of noncancerous lumps (fibroids).  LET [facility] CARE PROVIDER KNOW ABOUT:   Any allergies you have.   All medicines you are taking, including vitamins, herbs, eye drops, creams, and over-the-counter medicines.   Previous problems you or members of your family have had with the use of anesthetics.  Any blood disorders you have.   Previous surgeries you have had.   Medical conditions you have. RISKS AND COMPLICATIONS  Generally, this is a safe procedure. However, as with any procedure, complications can occur. Possible complications include:  Excessive bleeding.   Infection of the uterus.   Damage to the cervix.   Development of scar tissue (adhesions) inside the uterus, later causing abnormal amounts of menstrual bleeding.   Complications from the general anesthetic, if a general anesthetic is used.   Putting a hole (perforation) in the uterus. This is rare.  BEFORE THE PROCEDURE   Eat and drink before the procedure only as directed by your health care provider.   Arrange for someone to take you home.  PROCEDURE  This procedure usually takes about 15-30 minutes.  You will be given one of the following:  A medicine that numbs the area in and around the cervix (local anesthetic).   A medicine to make you sleep through the procedure (general anesthetic).  You will lie on your back with your legs in stirrups.   A warm metal or plastic instrument (speculum) will be placed in your vagina to keep it open and to allow the  health care provider to see the cervix.  There are two ways in which your cervix can be softened and dilated. These include:   Taking a medicine.   Having thin rods (laminaria) inserted into your cervix.   A curved tool (curette) will be used to scrape cells from the inside lining of the uterus. In some cases, gentle suction is applied with the curette. The curette will then be removed.  AFTER THE PROCEDURE   You will rest in the recovery area until you are stable and are ready to go home.   You may feel sick to your stomach (nauseous) or throw up (vomit) if you were given a general anesthetic.   You may have a sore throat if a tube was placed in your throat during general anesthesia.   You may have light cramping and bleeding. This may last for 2 days to 2 weeks after the procedure.   Your uterus needs to make a new lining after the procedure. This may make your next period late. Document Released: 04/03/2005 Document Revised: 12/04/2012 Document Reviewed: 10/31/2012 [facility] Patient Information 2015 [place], [place]. This information is not intended to replace advice given to you by your health care provider. Make sure you discuss any questions you have with your health care provider. PATIENT INSTRUCTIONS POST-ANESTHESIA  IMMEDIATELY FOLLOWING SURGERY:  Do not drive or operate machinery for the first twenty four hours after surgery.  Do not make any important decisions for twenty four hours after surgery or while taking narcotic pain medications or sedatives.  If you develop intractable nausea and vomiting or a severe headache please notify your doctor immediately.  FOLLOW-UP:  Please make an appointment with your surgeon as instructed. You do not need to follow up with anesthesia unless specifically instructed to do so.  WOUND CARE INSTRUCTIONS (if applicable):  Keep a dry clean dressing on the anesthesia/puncture wound site if there is drainage.  Once the wound has quit  draining you may leave it open to air.  Generally you should leave the bandage intact for twenty four hours unless there is drainage.  If the epidural site drains for more than 36-48 hours please call the anesthesia department.  QUESTIONS?:  Please feel free to call your physician or the hospital operator if you have  any questions, and they will be happy to assist you.

## 2013-10-21 ENCOUNTER — Other Ambulatory Visit: Payer: Self-pay | Admitting: Obstetrics & Gynecology

## 2013-10-21 ENCOUNTER — Encounter (HOSPITAL_COMMUNITY): Payer: Self-pay

## 2013-10-21 ENCOUNTER — Encounter (HOSPITAL_COMMUNITY)
Admission: RE | Admit: 2013-10-21 | Discharge: 2013-10-21 | Disposition: A | Discharge status: Home / Self Care | Payer: BC Managed Care – PPO | Source: Ambulatory Visit | Attending: Obstetrics & Gynecology | Admitting: Obstetrics & Gynecology

## 2013-10-21 LAB — COMPREHENSIVE METABOLIC PANEL
ALT: 10 U/L (ref 0–35)
AST: 12 U/L (ref 0–37)
Albumin: 3.7 g/dL (ref 3.5–5.2)
Alkaline Phosphatase: 48 U/L (ref 39–117)
Anion gap: 13 (ref 5–15)
BILIRUBIN TOTAL: 0.3 mg/dL (ref 0.3–1.2)
BUN: 8 mg/dL (ref 6–23)
CHLORIDE: 103 meq/L (ref 96–112)
CO2: 25 meq/L (ref 19–32)
Calcium: 9.7 mg/dL (ref 8.4–10.5)
Creatinine, Ser: 0.88 mg/dL (ref 0.50–1.10)
GFR calc Af Amer: >90 mL/min (ref >90)
GFR calc non Af Amer: 79 mL/min — ABNORMAL LOW (ref >90)
Glucose, Bld: 91 mg/dL (ref 70–99)
Potassium: 3.8 mEq/L (ref 3.7–5.3)
Sodium: 141 mEq/L (ref 137–147)
Total Protein: 6.7 g/dL (ref 6.0–8.3)

## 2013-10-21 LAB — CBC
HCT: 36 % (ref 36.0–46.0)
Hemoglobin: 12.2 g/dL (ref 12.0–15.0)
MCH: 31 pg (ref 26.0–34.0)
MCHC: 33.9 g/dL (ref 30.0–36.0)
MCV: 91.6 fL (ref 78.0–100.0)
PLATELETS: 241 10*3/uL (ref 150–400)
RBC: 3.93 MIL/uL (ref 3.87–5.11)
RDW: 12.9 % (ref 11.5–15.5)
WBC: 5.8 10*3/uL (ref 4.0–10.5)

## 2013-10-21 LAB — URINE MICROSCOPIC-ADD ON

## 2013-10-21 LAB — URINALYSIS, ROUTINE W REFLEX MICROSCOPIC
BILIRUBIN URINE: NEGATIVE
GLUCOSE, UA: NEGATIVE mg/dL
HGB URINE DIPSTICK: NEGATIVE
Ketones, ur: NEGATIVE mg/dL
Nitrite: NEGATIVE
PH: 6 (ref 5.0–8.0)
Protein, ur: NEGATIVE mg/dL
Specific Gravity, Urine: 1.02 (ref 1.005–1.030)
Urobilinogen, UA: 0.2 mg/dL (ref 0.0–1.0)

## 2013-10-21 LAB — HCG, QUANTITATIVE, PREGNANCY: hCG, Beta Chain, Quant, S: <1 m[IU]/mL (ref <5)

## 2013-10-21 MED ORDER — PHENYLEPHRINE HCL 2.5 % OP SOLN: 1.0000 [drp] | OPHTHALMIC | Status: DC

## 2013-10-21 MED ORDER — LIDOCAINE HCL 3.5 % OP GEL: 1.0000 "application " | Freq: Once | OPHTHALMIC | Status: DC

## 2013-10-21 MED ORDER — CYCLOPENTOLATE-PHENYLEPHRINE 0.2-1 % OP SOLN: 1.0000 [drp] | OPHTHALMIC | Status: DC

## 2013-10-21 MED ORDER — TETRACAINE HCL 0.5 % OP SOLN: 1.0000 [drp] | OPHTHALMIC | Status: DC

## 2013-10-24 ENCOUNTER — Ambulatory Visit (HOSPITAL_COMMUNITY)
Admission: RE | Admit: 2013-10-24 | Discharge: 2013-10-24 | Disposition: A | Discharge status: Home / Self Care | Payer: BC Managed Care – PPO | Source: Ambulatory Visit | Attending: Obstetrics & Gynecology | Admitting: Obstetrics & Gynecology

## 2013-10-24 ENCOUNTER — Encounter (HOSPITAL_COMMUNITY): Payer: BC Managed Care – PPO | Admitting: Anesthesiology

## 2013-10-24 ENCOUNTER — Encounter (HOSPITAL_COMMUNITY)
Admission: RE | Disposition: A | Discharge status: Home / Self Care | Payer: Self-pay | Source: Ambulatory Visit | Attending: Obstetrics & Gynecology

## 2013-10-24 ENCOUNTER — Ambulatory Visit (HOSPITAL_COMMUNITY): Payer: BC Managed Care – PPO | Admitting: Anesthesiology

## 2013-10-24 ENCOUNTER — Encounter (HOSPITAL_COMMUNITY): Payer: Self-pay | Admitting: *Deleted

## 2013-10-24 DIAGNOSIS — N840 Polyp of corpus uteri: Secondary | ICD-10-CM | POA: Insufficient documentation

## 2013-10-24 DIAGNOSIS — N920 Excessive and frequent menstruation with regular cycle: Secondary | ICD-10-CM

## 2013-10-24 DIAGNOSIS — N946 Dysmenorrhea, unspecified: Secondary | ICD-10-CM | POA: Insufficient documentation

## 2013-10-24 DIAGNOSIS — Z9889 Other specified postprocedural states: Secondary | ICD-10-CM

## 2013-10-24 DIAGNOSIS — D649 Anemia, unspecified: Secondary | ICD-10-CM | POA: Insufficient documentation

## 2013-10-24 DIAGNOSIS — Z79899 Other long term (current) drug therapy: Secondary | ICD-10-CM | POA: Insufficient documentation

## 2013-10-24 DIAGNOSIS — I10 Essential (primary) hypertension: Secondary | ICD-10-CM | POA: Insufficient documentation

## 2013-10-24 HISTORY — PX: DILITATION & CURRETTAGE/HYSTROSCOPY WITH THERMACHOICE ABLATION: SHX5569

## 2013-10-24 HISTORY — PX: POLYPECTOMY: SHX5525

## 2013-10-24 SURGERY — DILATATION & CURETTAGE/HYSTEROSCOPY WITH THERMACHOICE ABLATION
Anesthesia: General | Site: Uterus

## 2013-10-24 MED ORDER — PROMETHAZINE HCL 25 MG/ML IJ SOLN
INTRAMUSCULAR | Status: AC
  Filled 2013-10-24: qty 1

## 2013-10-24 MED ORDER — FENTANYL CITRATE 0.05 MG/ML IJ SOLN
25.0000 ug | INTRAMUSCULAR | Status: DC | PRN
  Administered 2013-10-24 (×2): 50 ug via INTRAVENOUS

## 2013-10-24 MED ORDER — PROMETHAZINE HCL 25 MG/ML IJ SOLN
12.5000 mg | Freq: Once | INTRAMUSCULAR | Status: AC
  Administered 2013-10-24: 12.5 mg via INTRAVENOUS

## 2013-10-24 MED ORDER — GLYCOPYRROLATE 0.2 MG/ML IJ SOLN
0.2000 mg | Freq: Once | INTRAMUSCULAR | Status: AC
  Administered 2013-10-24: 0.2 mg via INTRAVENOUS

## 2013-10-24 MED ORDER — SODIUM CHLORIDE 0.9 % IR SOLN
Status: DC | PRN
  Administered 2013-10-24: 1000 mL

## 2013-10-24 MED ORDER — FENTANYL CITRATE 0.05 MG/ML IJ SOLN
INTRAMUSCULAR | Status: AC
  Filled 2013-10-24: qty 2

## 2013-10-24 MED ORDER — LIDOCAINE HCL (PF) 1 % IJ SOLN
INTRAMUSCULAR | Status: AC
  Filled 2013-10-24: qty 5

## 2013-10-24 MED ORDER — MIDAZOLAM HCL 2 MG/2ML IJ SOLN
INTRAMUSCULAR | Status: AC
  Filled 2013-10-24: qty 2

## 2013-10-24 MED ORDER — PROPOFOL 10 MG/ML IV BOLUS
INTRAVENOUS | Status: AC
  Filled 2013-10-24: qty 20

## 2013-10-24 MED ORDER — KETOROLAC TROMETHAMINE 30 MG/ML IJ SOLN
INTRAMUSCULAR | Status: AC
Start: 2013-10-24 — End: 2013-10-24
  Filled 2013-10-24: qty 1

## 2013-10-24 MED ORDER — ONDANSETRON HCL 4 MG/2ML IJ SOLN
4.0000 mg | Freq: Once | INTRAMUSCULAR | Status: AC
  Administered 2013-10-24: 4 mg via INTRAVENOUS

## 2013-10-24 MED ORDER — FENTANYL CITRATE 0.05 MG/ML IJ SOLN
25.0000 ug | INTRAMUSCULAR | Status: AC
  Administered 2013-10-24: 50 ug via INTRAVENOUS

## 2013-10-24 MED ORDER — SODIUM CHLORIDE 0.9 % IJ SOLN
INTRAMUSCULAR | Status: AC
  Filled 2013-10-24: qty 10

## 2013-10-24 MED ORDER — FENTANYL CITRATE 0.05 MG/ML IJ SOLN
25.0000 ug | Freq: Once | INTRAMUSCULAR | Status: AC
  Administered 2013-10-24: 25 ug via INTRAVENOUS

## 2013-10-24 MED ORDER — OXYCODONE-ACETAMINOPHEN 7.5-325 MG PO TABS: 1.0000 | ORAL_TABLET | Freq: Four times a day (QID) | ORAL | Status: DC | PRN

## 2013-10-24 MED ORDER — LIDOCAINE HCL 1 % IJ SOLN
INTRAMUSCULAR | Status: DC | PRN
  Administered 2013-10-24: 50 mg via INTRADERMAL

## 2013-10-24 MED ORDER — LACTATED RINGERS IV SOLN
INTRAVENOUS | Status: DC
  Administered 2013-10-24: 08:00:00 via INTRAVENOUS

## 2013-10-24 MED ORDER — MIDAZOLAM HCL 2 MG/2ML IJ SOLN
1.0000 mg | INTRAMUSCULAR | Status: DC | PRN
  Administered 2013-10-24: 2 mg via INTRAVENOUS

## 2013-10-24 MED ORDER — KETOROLAC TROMETHAMINE 30 MG/ML IJ SOLN
30.0000 mg | Freq: Once | INTRAMUSCULAR | Status: AC
  Administered 2013-10-24: 30 mg via INTRAVENOUS

## 2013-10-24 MED ORDER — DEXTROSE 5 % IV SOLN
INTRAVENOUS | Status: DC | PRN
  Administered 2013-10-24: 500 mL

## 2013-10-24 MED ORDER — ONDANSETRON HCL 8 MG PO TABS: 8.0000 mg | ORAL_TABLET | Freq: Three times a day (TID) | ORAL | Status: DC | PRN

## 2013-10-24 MED ORDER — PROPOFOL 10 MG/ML IV BOLUS
INTRAVENOUS | Status: DC | PRN
  Administered 2013-10-24: 120 mg via INTRAVENOUS

## 2013-10-24 MED ORDER — ONDANSETRON HCL 4 MG/2ML IJ SOLN
INTRAMUSCULAR | Status: AC
  Filled 2013-10-24: qty 2

## 2013-10-24 MED ORDER — ONDANSETRON HCL 4 MG/2ML IJ SOLN
4.0000 mg | Freq: Once | INTRAMUSCULAR | Status: DC | PRN
  Filled 2013-10-24: qty 2

## 2013-10-24 MED ORDER — LACTATED RINGERS IV SOLN: Freq: Once | INTRAVENOUS | Status: DC

## 2013-10-24 MED ORDER — KETOROLAC TROMETHAMINE 10 MG PO TABS: 10.0000 mg | ORAL_TABLET | Freq: Three times a day (TID) | ORAL | Status: DC | PRN

## 2013-10-24 MED ORDER — CEFAZOLIN SODIUM-DEXTROSE 2-3 GM-% IV SOLR
INTRAVENOUS | Status: AC
Start: 2013-10-24 — End: 2013-10-24
  Filled 2013-10-24: qty 50

## 2013-10-24 MED ORDER — CEFAZOLIN SODIUM-DEXTROSE 2-3 GM-% IV SOLR
2.0000 g | INTRAVENOUS | Status: AC
Start: 2013-10-24 — End: 2013-10-24
  Administered 2013-10-24: 2 g via INTRAVENOUS

## 2013-10-24 MED ORDER — GLYCOPYRROLATE 0.2 MG/ML IJ SOLN
INTRAMUSCULAR | Status: AC
  Filled 2013-10-24: qty 1

## 2013-10-24 MED ORDER — FENTANYL CITRATE 0.05 MG/ML IJ SOLN
INTRAMUSCULAR | Status: DC | PRN
  Administered 2013-10-24 (×4): 25 ug via INTRAVENOUS

## 2013-10-24 MED ORDER — SEVOFLURANE IN SOLN
RESPIRATORY_TRACT | Status: AC
  Filled 2013-10-24: qty 250

## 2013-10-24 MED ORDER — MIDAZOLAM HCL 5 MG/5ML IJ SOLN
INTRAMUSCULAR | Status: DC | PRN
  Administered 2013-10-24: 2 mg via INTRAVENOUS

## 2013-10-24 SURGICAL SUPPLY — 36 items
BAG DECANTER FOR FLEXI CONT (MISCELLANEOUS) ×2 IMPLANT
BAG HAMPER (MISCELLANEOUS) ×2 IMPLANT
BLADE 15 SAFETY STRL DISP (BLADE) ×1 IMPLANT
CATH THERMACHOICE III (CATHETERS) ×2 IMPLANT
CLOTH BEACON ORANGE TIMEOUT ST (SAFETY) ×2 IMPLANT
COVER LIGHT HANDLE STERIS (MISCELLANEOUS) ×4 IMPLANT
COVER MAYO STAND XLG (DRAPE) ×2 IMPLANT
FORMALIN 10 PREFIL 120ML (MISCELLANEOUS) ×3 IMPLANT
FORMALIN 10 PREFIL 480ML (MISCELLANEOUS) ×1 IMPLANT
GAUZE SPONGE 4X4 16PLY XRAY LF (GAUZE/BANDAGES/DRESSINGS) ×2 IMPLANT
GLOVE BIOGEL M 7.0 STRL (GLOVE) ×1 IMPLANT
GLOVE BIOGEL PI IND STRL 8 (GLOVE) ×1 IMPLANT
GLOVE BIOGEL PI INDICATOR 8 (GLOVE) ×2
GLOVE ECLIPSE 7.0 STRL STRAW (GLOVE) ×1 IMPLANT
GLOVE ECLIPSE 8.0 STRL XLNG CF (GLOVE) ×2 IMPLANT
GOWN STRL REUS W/TWL LRG LVL3 (GOWN DISPOSABLE) ×4 IMPLANT
GOWN STRL REUS W/TWL XL LVL3 (GOWN DISPOSABLE) ×2 IMPLANT
INST SET HYSTEROSCOPY (KITS) ×2 IMPLANT
IV D5W 500ML (IV SOLUTION) ×2 IMPLANT
IV NS 1000ML (IV SOLUTION) ×2
IV NS 1000ML BAXH (IV SOLUTION) ×1 IMPLANT
KIT ROOM TURNOVER AP CYSTO (KITS) ×2 IMPLANT
MANIFOLD NEPTUNE II (INSTRUMENTS) ×2 IMPLANT
MARKER SKIN DUAL TIP RULER LAB (MISCELLANEOUS) ×2 IMPLANT
NS IRRIG 1000ML POUR BTL (IV SOLUTION) ×1 IMPLANT
PACK BASIC III (CUSTOM PROCEDURE TRAY) ×2
PACK PERI GYN (CUSTOM PROCEDURE TRAY) ×1 IMPLANT
PACK SRG BSC III STRL LF ECLPS (CUSTOM PROCEDURE TRAY) ×1 IMPLANT
PAD ARMBOARD 7.5X6 YLW CONV (MISCELLANEOUS) ×2 IMPLANT
PAD TELFA 3X4 1S STER (GAUZE/BANDAGES/DRESSINGS) ×2 IMPLANT
SET BASIN LINEN APH (SET/KITS/TRAYS/PACK) ×2 IMPLANT
SET IRRIG Y TYPE TUR BLADDER L (SET/KITS/TRAYS/PACK) ×2 IMPLANT
SHEET LAVH (DRAPES) ×2 IMPLANT
SUT MNCRL 0 VIOLET CTX 36 (SUTURE) ×1 IMPLANT
SUT MONOCRYL 0 CTX 36 (SUTURE)
YANKAUER SUCT BULB TIP 10FT TU (MISCELLANEOUS) ×2 IMPLANT

## 2013-10-24 NOTE — H&P (Signed)
Preoperative History and Physical  Becky Henry is a 43 y.o. [id number] with Patient's last menstrual period was 10/06/2013. admitted for a hysterosocpy uterine curettage endometrial ablation and removal of endometrial polyp.  Sonogram reveals rather large endometrial polyp  PMH:  Past Medical History   Diagnosis  Date   .  Labial lesion  01/28/2013   .  Abnormal Pap smear    .  Anemia    .  Hypertension    .  Herpes simplex without mention of complication    .  Vaginal Pap smear, abnormal    .  Unspecified symptom associated with female genital organs  08/26/2013   .  Left ovarian cyst  08/26/2013   .  Endometrial polyp  08/26/2013   .  Insomnia  09/25/2013   PSH:  Past Surgical History   Procedure  Laterality  Date   .  Tubal ligation     POb/GynH:  OB History    Grav  Para  Term  Preterm  Abortions  TAB  SAB  Ect  Mult  Living    5  4    1   1    4      SH:  History   Substance Use Topics   .  Smoking status:  Never Smoker   .  Smokeless tobacco:  Never Used   .  Alcohol Use:  No      Comment: occ.   FH:  Family History   Problem  Relation  Age of Onset   .  Diabetes  Mother    .  Mental illness  Mother    .  Diabetes  Father    .  Cancer  Father    .  Diabetes  Maternal Grandmother    .  Diabetes  Maternal Grandfather    .  Diabetes  Paternal Grandmother    .  Diabetes  Paternal Grandfather    Allergies:  Allergies   Allergen  Reactions   .  Sulfa Antibiotics  Hives and Swelling     Also affected liver.   Medications: Current outpatient prescriptions:LORazepam (ATIVAN) 0.5 MG tablet, Take 1 at hs prn not every night, Disp: 30 tablet, Rfl: 2; valACYclovir (VALTREX) 1000 MG tablet, Take 1 po daily, Disp: 30 tablet, Rfl: prn; ciprofloxacin (CIPRO) 500 MG tablet, Take 1 tablet (500 mg total) by mouth 2 (two) times daily., Disp: 20 tablet, Rfl: 0  doxycycline (MONODOX) 100 MG capsule, Take 1 capsule (100 mg total) by mouth 2 (two) times daily., Disp: 20 capsule, Rfl: 0;  HYDROcodone-acetaminophen (NORCO/VICODIN) 5-325 MG per tablet, Take 1 tablet by mouth every 6 (six) hours as needed for moderate pain., Disp: , Rfl: ; promethazine (PHENERGAN) 25 MG tablet, Take 1 tablet (25 mg total) by mouth every 6 (six) hours as needed for nausea or vomiting., Disp: 30 tablet, Rfl: 1  Review of Systems:  Review of Systems  Constitutional: Negative for fever, chills, weight loss, malaise/fatigue and diaphoresis.  HENT: Negative for hearing loss, ear pain, nosebleeds, congestion, sore throat, neck pain, tinnitus and ear discharge.  Eyes: Negative for blurred vision, double vision, photophobia, pain, discharge and redness.  Respiratory: Negative for cough, hemoptysis, sputum production, shortness of breath, wheezing and stridor.  Cardiovascular: Negative for chest pain, palpitations, orthopnea, claudication, leg swelling and PND.  Gastrointestinal: Positive for abdominal pain. Negative for heartburn, nausea, vomiting, diarrhea, constipation, blood in stool and melena.  Genitourinary: Negative for dysuria, urgency, frequency, hematuria and flank pain.  Musculoskeletal: Negative for myalgias, back pain, joint pain and falls.  Skin: Negative for itching and rash.  Neurological: Negative for dizziness, tingling, tremors, sensory change, speech change, focal weakness, seizures, loss of consciousness, weakness and headaches.  Endo/Heme/Allergies: Negative for environmental allergies and polydipsia. Does not bruise/bleed easily.  Psychiatric/Behavioral: Negative for depression, suicidal ideas, hallucinations, memory loss and substance abuse. The patient is not nervous/anxious and does not have insomnia.  PHYSICAL EXAM:  Blood pressure 120/80, height 5\' 4"  (1.626 m), weight 148 lb (67.132 kg), last menstrual period 10/06/2013.  Vitals reviewed.  Constitutional: She is oriented to person, place, and time. She appears well-developed and well-nourished.  HENT:  Head: Normocephalic and  atraumatic.  Right Ear: External ear normal.  Left Ear: External ear normal.  Nose: Nose normal.  Mouth/Throat: Oropharynx is clear and moist.  Eyes: Conjunctivae and EOM are normal. Pupils are equal, round, and reactive to light. Right eye exhibits no discharge. Left eye exhibits no discharge. No scleral icterus.  Neck: Normal range of motion. Neck supple. No tracheal deviation present. No thyromegaly present.  Cardiovascular: Normal rate, regular rhythm, normal heart sounds and intact distal pulses. Exam reveals no gallop and no friction rub.  No murmur heard.  Respiratory: Effort normal and breath sounds normal. No respiratory distress. She has no wheezes. She has no rales. She exhibits no tenderness.  GI: Soft. Bowel sounds are normal. She exhibits no distension and no mass. There is tenderness. There is no rebound and no guarding.  Genitourinary:  Vulva is normal without lesions Vagina is pink moist without discharge Cervix normal in appearance and pap is normal Uterus is normal Adnexa is negative with normal sized ovaries by sonogram  Musculoskeletal: Normal range of motion. She exhibits no edema and no tenderness.  Neurological: She is alert and oriented to person, place, and time. She has normal reflexes. She displays normal reflexes. No cranial nerve deficit. She exhibits normal muscle tone. Coordination normal.  Skin: Skin is warm and dry. No rash noted. No erythema. No pallor.  Psychiatric: She has a normal mood and affect. Her behavior is normal. Judgment and thought content normal.  Labs:  No results found for this or any previous visit (from the past 336 hour(s)).  EKG:  No orders found for this or any previous visit.  Imaging Studies:  US Pelvis Limited  10/01/2013 GYNECOLOGIC SONOGRAM MIGDALIA OLEJNICZAK is a 43 y.o. [id number] LMP 09/18/2013 for a pelvic sonogram for follow up ?endometrial polyp per previous u/s. Uterus Anteverted no myometrial masses noted Endometrium 10.23mm  hyperechoic mass noted within cavity with +Doppler flow noted (polyp) Right ovary Ovary appears WNL Left ovary Ovary appears WNL No free fluid noted within the pelvis Technician Comments: Anteverted uterus noted with 10.41mm endometrial ?polyp noted within, bilateral ovaries appear WNL Chari Manning 09/25/2013 3:40 PM Clinical Impression and recommendations: I have reviewed the sonogram results above. Combined with the patient's current clinical course, below are my impressions and any appropriate recommendations for management based on the sonographic findings: 1. Persistent presence of 10.6 mm endometrial polyp 2. Tissue sampling advised, by biopsy or probable hysteroscopic resection. FERGUSON,JOHN V  Assessment:  Patient Active Problem List    Diagnosis  Date Noted   .  Insomnia  09/25/2013   .  Unspecified symptom associated with female genital organs  08/26/2013   .  Left ovarian cyst  08/26/2013   .  Endometrial polyp  08/26/2013   .  Labial lesion  01/28/2013   Plan:  Hysteroscopy uterine curettage endometrial ablation with removal of endometrial polyp   Deva Ron H 10/24/2013 7:14 AM

## 2013-10-24 NOTE — Discharge Instructions (Signed)
Endometrial Ablation Endometrial ablation removes the lining of the uterus (endometrium). It is usually a same-day, outpatient treatment. Ablation helps avoid major surgery, such as surgery to remove the cervix and uterus (hysterectomy). After endometrial ablation, you will have little or no menstrual bleeding and may not be able to have children. However, if you are premenopausal, you will need to use a reliable method of birth control following the procedure because of the small chance that pregnancy can occur. There are different reasons to have this procedure, which include:  Heavy periods.  Bleeding that is causing anemia.  Irregular bleeding.  Bleeding fibroids on the lining inside the uterus if they are smaller than 3 centimeters. This procedure should not be done if:  You want children in the future.  You have severe cramps with your menstrual period.  You have precancerous or cancerous cells in your uterus.  You were recently pregnant.  You have gone through menopause.  You have had major surgery on the uterus, such as a cesarean delivery. LET YOUR HEALTH CARE PROVIDER KNOW ABOUT:  Any allergies you have.  All medicines you are taking, including vitamins, herbs, eye drops, creams, and over-the-counter medicines.  Previous problems you or members of your family have had with the use of anesthetics.  Any blood disorders you have.  Previous surgeries you have had.  Medical conditions you have. RISKS AND COMPLICATIONS  Generally, this is a safe procedure. However, as with any procedure, complications can occur. Possible complications include:  Perforation of the uterus.  Bleeding.  Infection of the uterus, bladder, or vagina.  Injury to surrounding organs.  An air bubble to the lung (air embolus).  Pregnancy following the procedure.  Failure of the procedure to help the problem, requiring hysterectomy.  Decreased ability to diagnose cancer in the lining of  the uterus. BEFORE THE PROCEDURE  The lining of the uterus must be tested to make sure there is no pre-cancerous or cancer cells present.  An ultrasound may be performed to look at the size of the uterus and to check for abnormalities.  Medicines may be given to thin the lining of the uterus. PROCEDURE  During the procedure, your health care provider will use a tool called a resectoscope to help see inside your uterus. There are different ways to remove the lining of your uterus.   Radiofrequency - This method uses a radiofrequency-alternating electric current to remove the lining of the uterus.  Cryotherapy - This method uses extreme cold to freeze the lining of the uterus.  Heated-Free Liquid - This method uses heated salt (saline) solution to remove the lining of the uterus.  Microwave - This method uses high-energy microwaves to heat up the lining of the uterus to remove it.  Thermal balloon - This method involves inserting a catheter with a balloon tip into the uterus. The balloon tip is filled with heated fluid to remove the lining of the uterus. AFTER THE PROCEDURE  After your procedure, do not have sexual intercourse or insert anything into your vagina until permitted by your health care provider. After the procedure, you may experience:  Cramps.  Vaginal discharge.  Frequent urination. Document Released: 02/11/2004 Document Revised: 12/04/2012 Document Reviewed: 09/04/2012 Exit[employer] Patient Information 2015 [employer]. This information is not intended to replace advice given to you by your health care provider. Make sure you discuss any questions you have with your health care provider.  

## 2013-10-24 NOTE — Op Note (Signed)
Preoperative diagnosis: Menometrorrhagia                                        Dysmenorrhea                                         Endometrial polyp  Postoperative diagnoses: Same as above   Procedure: Hysteroscopy, uterine curettage, endometrial ablation, removal of endometrial polyp  Surgeon: Despina HiddenEure MD  Anesthesia: Laryngeal mask airway  Findings: The endometrium was normal except for a single endometrial polyp. There were no fibroid or other abnormalities.  Description of operation: The patient was taken to the operating room and placed in the supine position. She underwent general anesthesia using the laryngeal mask airway. She was placed in the dorsal lithotomy position and prepped and draped in the usual sterile fashion. A Graves speculum was placed and the anterior cervical lip was grasped with a single-tooth tenaculum. The cervix was dilated serially to allow passage of the hysteroscope. Diagnostic hysteroscopy was performed and was found to be normal. A vigorous uterine curettage was then performed and all tissue sent to pathology for evaluation. The ThermaChoice 3 endometrial ablation balloon was then used were 16 cc of D5W was required to maintain a pressure of 190-200 mm of mercury throughout the procedure. Toatl therapy time was 9:11.  All of the equipment worked well throughout the procedure. All of the fluid was returned at the end of the procedure. The patient was awakened from anesthesia and taken to the recovery room in good stable condition all counts were correct. She received 2 g of Ancef and 30 mg of Toradol preoperatively. She will be discharged from the recovery room and followed up in the office in 1- 2 weeks.  Becky Henry 10/24/2013 8:31 AM

## 2013-10-24 NOTE — Anesthesia Procedure Notes (Signed)
Procedure Name: LMA Insertion Date/Time: 10/24/2013 7:44 AM Performed by: Despina HiddenIDACAVAGE, Gene Colee J Pre-anesthesia Checklist: Emergency Drugs available, Patient identified, Suction available and Patient being monitored Patient Re-evaluated:Patient Re-evaluated prior to inductionOxygen Delivery Method: Circle system utilized Preoxygenation: Pre-oxygenation with 100% oxygen Intubation Type: IV induction Ventilation: Mask ventilation without difficulty LMA: LMA inserted LMA Size: 4.0 Grade View: Grade I Tube type: Oral Number of attempts: 1 Placement Confirmation: positive ETCO2 and breath sounds checked- equal and bilateral Tube secured with: Tape Dental Injury: Teeth and Oropharynx as per pre-operative assessment

## 2013-10-24 NOTE — Transfer of Care (Signed)
Immediate Anesthesia Transfer of Care Note  Patient: Becky NoonMarie E Moure  Procedure(s) Performed: Procedure(s) with comments: DILATATION & CURETTAGE/HYSTEROSCOPY WITH THERMACHOICE ABLATION (N/A) - 16 ml in  16 ml out temperature 87 degree celcius total therapy time 9minutes and 11 seconds ENDOMETRIAL POLYPECTOMY (N/A)  Patient Location: PACU  Anesthesia Type:General  Level of Consciousness: awake and patient cooperative  Airway & Oxygen Therapy: Patient Spontanous Breathing and Patient connected to face mask oxygen  Post-op Assessment: Report given to PACU RN, Post -op Vital signs reviewed and stable and Patient moving all extremities  Post vital signs: Reviewed and stable  Complications: No apparent anesthesia complications

## 2013-10-24 NOTE — Anesthesia Postprocedure Evaluation (Signed)
  Anesthesia Post-op Note  Patient: Becky NoonMarie E Henry  Procedure(s) Performed: Procedure(s) with comments: DILATATION & CURETTAGE/HYSTEROSCOPY WITH THERMACHOICE ABLATION (N/A) - 16 ml in  16 ml out temperature 87 degree celcius total therapy time 9minutes and 11 seconds ENDOMETRIAL POLYPECTOMY (N/A)  Patient Location: PACU  Anesthesia Type:General  Level of Consciousness: awake, alert , oriented and patient cooperative  Airway and Oxygen Therapy: Patient Spontanous Breathing  Post-op Pain: 3 /10, mild  Post-op Assessment: Post-op Vital signs reviewed, Patient's Cardiovascular Status Stable, Respiratory Function Stable, Patent Airway and NAUSEA AND VOMITING PRESENT  Post-op Vital Signs: Reviewed and stable  Last Vitals:  Filed Vitals:   10/24/13 0634  Temp: 36.6 C    Complications: No apparent anesthesia complications

## 2013-10-24 NOTE — Anesthesia Preprocedure Evaluation (Signed)
Anesthesia Evaluation  Patient identified by MRN, date of birth, ID band Patient awake    Reviewed: Allergy & Precautions, H&P , NPO status , Patient's Chart, lab work & pertinent test results  Airway Mallampati: I TM Distance: >3 FB Neck ROM: Full    Dental   Pulmonary neg pulmonary ROS,  breath sounds clear to auscultation        Cardiovascular hypertension (no meds), Rhythm:Regular Rate:Normal     Neuro/Psych    GI/Hepatic   Endo/Other    Renal/GU      Musculoskeletal   Abdominal   Peds  Hematology  (+) anemia ,   Anesthesia Other Findings   Reproductive/Obstetrics                           Anesthesia Physical Anesthesia Plan  ASA: II  Anesthesia Plan: General   Post-op Pain Management:    Induction: Intravenous  Airway Management Planned: LMA  Additional Equipment:   Intra-op Plan:   Post-operative Plan: Extubation in OR  Informed Consent: I have reviewed the patients History and Physical, chart, labs and discussed the procedure including the risks, benefits and alternatives for the proposed anesthesia with the patient or authorized representative who has indicated his/her understanding and acceptance.     Plan Discussed with:   Anesthesia Plan Comments:         Anesthesia Quick Evaluation

## 2013-10-27 ENCOUNTER — Encounter (HOSPITAL_COMMUNITY): Payer: Self-pay | Admitting: Obstetrics & Gynecology

## 2013-10-30 ENCOUNTER — Ambulatory Visit (INDEPENDENT_AMBULATORY_CARE_PROVIDER_SITE_OTHER): Payer: BC Managed Care – PPO | Admitting: Obstetrics & Gynecology

## 2013-10-30 ENCOUNTER — Encounter: Payer: Self-pay | Admitting: Obstetrics & Gynecology

## 2013-10-30 VITALS — BP 110/70 | Wt 151.0 lb

## 2013-10-30 DIAGNOSIS — Z9889 Other specified postprocedural states: Secondary | ICD-10-CM

## 2013-10-30 NOTE — Progress Notes (Signed)
Patient ID: Becky Henry, female   DOB: July 26, 1970, 43 y.o.   MRN: [id number] 1 week post op from hysteroscopy uterine curettage endometrial ablation Doing well Had some nausea post operatively resolved now Some residual pain Appropriate post operative discharge  Exam Normal post ablation findings  Follow up 1 year

## 2013-10-31 ENCOUNTER — Encounter: Payer: BC Managed Care – PPO | Admitting: Obstetrics & Gynecology

## 2014-02-16 ENCOUNTER — Encounter: Payer: Self-pay | Admitting: Obstetrics & Gynecology

## 2014-03-28 ENCOUNTER — Other Ambulatory Visit: Payer: Self-pay | Admitting: Adult Health

## 2014-04-02 ENCOUNTER — Telehealth: Payer: Self-pay | Admitting: Adult Health

## 2014-04-02 MED ORDER — VALACYCLOVIR HCL 1 G PO TABS: 1000.0000 mg | ORAL_TABLET | Freq: Every day | ORAL | Status: DC

## 2014-04-02 NOTE — Telephone Encounter (Signed)
Wants valtrex refill, and says has had elevated BP, make appt , try decreasing salt and carbs

## 2014-11-03 ENCOUNTER — Encounter: Payer: Self-pay | Admitting: Adult Health

## 2014-11-03 ENCOUNTER — Other Ambulatory Visit (HOSPITAL_COMMUNITY)
Admission: RE | Admit: 2014-11-03 | Discharge: 2014-11-03 | Disposition: A | Discharge status: Home / Self Care | Payer: Self-pay | Source: Ambulatory Visit | Attending: Adult Health | Admitting: Adult Health

## 2014-11-03 ENCOUNTER — Ambulatory Visit (INDEPENDENT_AMBULATORY_CARE_PROVIDER_SITE_OTHER): Payer: BLUE CROSS/BLUE SHIELD | Admitting: Adult Health

## 2014-11-03 ENCOUNTER — Other Ambulatory Visit: Payer: Self-pay | Admitting: Adult Health

## 2014-11-03 VITALS — BP 140/82 | HR 60 | Ht 62.25 in | Wt 144.0 lb

## 2014-11-03 DIAGNOSIS — Z1212 Encounter for screening for malignant neoplasm of rectum: Secondary | ICD-10-CM | POA: Diagnosis not present

## 2014-11-03 DIAGNOSIS — F439 Reaction to severe stress, unspecified: Secondary | ICD-10-CM

## 2014-11-03 DIAGNOSIS — I10 Essential (primary) hypertension: Secondary | ICD-10-CM | POA: Insufficient documentation

## 2014-11-03 DIAGNOSIS — Z1151 Encounter for screening for human papillomavirus (HPV): Secondary | ICD-10-CM | POA: Insufficient documentation

## 2014-11-03 DIAGNOSIS — Z01419 Encounter for gynecological examination (general) (routine) without abnormal findings: Secondary | ICD-10-CM | POA: Diagnosis not present

## 2014-11-03 DIAGNOSIS — N943 Premenstrual tension syndrome: Secondary | ICD-10-CM

## 2014-11-03 HISTORY — DX: Premenstrual tension syndrome: N94.3

## 2014-11-03 HISTORY — DX: Reaction to severe stress, unspecified: F43.9

## 2014-11-03 LAB — HEMOCCULT GUIAC POC 1CARD (OFFICE): Fecal Occult Blood, POC: NEGATIVE

## 2014-11-03 MED ORDER — SERTRALINE HCL 50 MG PO TABS: 50.0000 mg | ORAL_TABLET | Freq: Every day | ORAL | Status: DC

## 2014-11-03 MED ORDER — HYDROCHLOROTHIAZIDE 12.5 MG PO CAPS: 12.5000 mg | ORAL_CAPSULE | Freq: Every day | ORAL | Status: DC

## 2014-11-03 NOTE — Patient Instructions (Signed)
Stress Stress-related medical problems are becoming increasingly common. The body has a built-in physical response to stressful situations. Faced with pressure, challenge or danger, we need to react quickly. Our bodies release hormones such as cortisol and adrenaline to help do this. These hormones are part of the "fight or flight" response and affect the metabolic rate, heart rate and blood pressure, resulting in a heightened, stressed state that prepares the body for optimum performance in dealing with a stressful situation. It is likely that early man required these mechanisms to stay alive, but usually modern stresses do not call for this, and the same hormones released in today's world can damage health and reduce coping ability. CAUSES  Pressure to perform at work, at school or in sports.  Threats of physical violence.  Money worries.  Arguments.  Family conflicts.  Divorce or separation from significant other.  Bereavement.  New job or unemployment.  Changes in location.  Alcohol or drug abuse. SOMETIMES, THERE IS NO PARTICULAR REASON FOR DEVELOPING STRESS. Almost all people are at risk of being stressed at some time in their lives. It is important to know that some stress is temporary and some is long term.  Temporary stress will go away when a situation is resolved. Most people can cope with short periods of stress, and it can often be relieved by relaxing, taking a walk or getting any type of exercise, chatting through issues with friends, or having a good night's sleep.  Chronic (long-term, continuous) stress is much harder to deal with. It can be psychologically and emotionally damaging. It can be harmful both for an individual and for friends and family. SYMPTOMS Everyone reacts to stress differently. There are some common effects that help us recognize it. In times of extreme stress, people may:  Shake uncontrollably.  Breathe faster and deeper than normal  (hyperventilate).  Vomit.  For people with asthma, stress can trigger an attack.  For some people, stress may trigger migraine headaches, ulcers, and body pain. PHYSICAL EFFECTS OF STRESS MAY INCLUDE:  Loss of energy.  Skin problems.  Aches and pains resulting from tense muscles, including neck ache, backache and tension headaches.  Increased pain from arthritis and other conditions.  Irregular heart beat (palpitations).  Periods of irritability or anger.  Apathy or depression.  Anxiety (feeling uptight or worrying).  Unusual behavior.  Loss of appetite.  Comfort eating.  Lack of concentration.  Loss of, or decreased, sex-drive.  Increased smoking, drinking, or recreational drug use.  For women, missed periods.  Ulcers, joint pain, and muscle pain. Post-traumatic stress is the stress caused by any serious accident, strong emotional damage, or extremely difficult or violent experience such as rape or war. Post-traumatic stress victims can experience mixtures of emotions such as fear, shame, depression, guilt or anger. It may include recurrent memories or images that may be haunting. These feelings can last for weeks, months or even years after the traumatic event that triggered them. Specialized treatment, possibly with medicines and psychological therapies, is available. If stress is causing physical symptoms, severe distress or making it difficult for you to function as normal, it is worth seeing your caregiver. It is important to remember that although stress is a usual part of life, extreme or prolonged stress can lead to other illnesses that will need treatment. It is better to visit a doctor sooner rather than later. Stress has been linked to the development of high blood pressure and heart disease, as well as insomnia and depression.   There is no diagnostic test for stress since everyone reacts to it differently. But a caregiver will be able to spot the physical  symptoms, such as:  Headaches.  Shingles.  Ulcers. Emotional distress such as intense worry, low mood or irritability should be detected when the doctor asks pertinent questions to identify any underlying problems that might be the cause. In case there are physical reasons for the symptoms, the doctor may also want to do some tests to exclude certain conditions. If you feel that you are suffering from stress, try to identify the aspects of your life that are causing it. Sometimes you may not be able to change or avoid them, but even a small change can have a positive ripple effect. A simple lifestyle change can make all the difference. STRATEGIES THAT CAN HELP DEAL WITH STRESS:  Delegating or sharing responsibilities.  Avoiding confrontations.  Learning to be more assertive.  Regular exercise.  Avoid using alcohol or street drugs to cope.  Eating a healthy, balanced diet, rich in fruit and vegetables and proteins.  Finding humor or absurdity in stressful situations.  Never taking on more than you know you can handle comfortably.  Organizing your time better to get as much done as possible.  Talking to friends or family and sharing your thoughts and fears.  Listening to music or relaxation tapes.  Relaxation techniques like deep breathing, meditation, and yoga.  Tensing and then relaxing your muscles, starting at the toes and working up to the head and neck. If you think that you would benefit from help, either in identifying the things that are causing your stress or in learning techniques to help you relax, see a caregiver who is capable of helping you with this. Rather than relying on medications, it is usually better to try and identify the things in your life that are causing stress and try to deal with them. There are many techniques of managing stress including counseling, psychotherapy, aromatherapy, yoga, and exercise. Your caregiver can help you determine what is best  for you. Document Released: 06/24/2002 Document Revised: 04/08/2013 Document Reviewed: 05/21/2007 [facility] Patient Information 2015 [place], [place]. This information is not intended to replace advice given to you by your health care provider. Make sure you discuss any questions you have with your health care provider. Hypertension Hypertension, commonly called high blood pressure, is when the force of blood pumping through your arteries is too strong. Your arteries are the blood vessels that carry blood from your heart throughout your body. A blood pressure reading consists of a higher number over a lower number, such as 110/72. The higher number (systolic) is the pressure inside your arteries when your heart pumps. The lower number (diastolic) is the pressure inside your arteries when your heart relaxes. Ideally you want your blood pressure below 120/80. Hypertension forces your heart to work harder to pump blood. Your arteries may become narrow or stiff. Having hypertension puts you at risk for heart disease, stroke, and other problems.  RISK FACTORS Some risk factors for high blood pressure are controllable. Others are not.  Risk factors you cannot control include:   Race. You may be at higher risk if you are African American.  Age. Risk increases with age.  Gender. Men are at higher risk than women before age 845 years. After age 45, women are at higher risk than men. Risk factors you can control include:  Not getting enough exercise or physical activity.  Being overweight.  Getting too much  fat, sugar, calories, or salt in your diet.  Drinking too much alcohol. SIGNS AND SYMPTOMS Hypertension does not usually cause signs or symptoms. Extremely high blood pressure (hypertensive crisis) may cause headache, anxiety, shortness of breath, and nosebleed. DIAGNOSIS  To check if you have hypertension, your health care provider will measure your blood pressure while you are seated, with your  arm held at the level of your heart. It should be measured at least twice using the same arm. Certain conditions can cause a difference in blood pressure between your right and left arms. A blood pressure reading that is higher than normal on one occasion does not mean that you need treatment. If one blood pressure reading is high, ask your health care provider about having it checked again. TREATMENT  Treating high blood pressure includes making lifestyle changes and possibly taking medicine. Living a healthy lifestyle can help lower high blood pressure. You may need to change some of your habits. Lifestyle changes may include:  Following the DASH diet. This diet is high in fruits, vegetables, and whole grains. It is low in salt, red meat, and added sugars.  Getting at least 2 hours of brisk physical activity every week.  Losing weight if necessary.  Not smoking.  Limiting alcoholic beverages.  Learning ways to reduce stress. If lifestyle changes are not enough to get your blood pressure under control, your health care provider may prescribe medicine. You may need to take more than one. Work closely with your health care provider to understand the risks and benefits. HOME CARE INSTRUCTIONS  Have your blood pressure rechecked as directed by your health care provider.   Take medicines only as directed by your health care provider. Follow the directions carefully. Blood pressure medicines must be taken as prescribed. The medicine does not work as well when you skip doses. Skipping doses also puts you at risk for problems.   Do not smoke.   Monitor your blood pressure at home as directed by your health care provider. SEEK MEDICAL CARE IF:   You think you are having a reaction to medicines taken.  You have recurrent headaches or feel dizzy.  You have swelling in your ankles.  You have trouble with your vision. SEEK IMMEDIATE MEDICAL CARE IF:  You develop a severe headache or  confusion.  You have unusual weakness, numbness, or feel faint.  You have severe chest or abdominal pain.  You vomit repeatedly.  You have trouble breathing. MAKE SURE YOU:   Understand these instructions.  Will watch your condition.  Will get help right away if you are not doing well or get worse. Document Released: 04/03/2005 Document Revised: 08/18/2013 Document Reviewed: 01/24/2013 [facility] Patient Information 2015 [place], [place]. This information is not intended to replace advice given to you by your health care provider. Make sure you discuss any questions you have with your health care provider. Take microzide 1 daily Take zoloft 1 daily Follow up in 4 weeks Take time for self Physical in 1 year Mammogram now and year See eye doctor

## 2014-11-03 NOTE — Progress Notes (Signed)
Patient ID: Becky Henry, female   DOB: 1970/07/29, 44 y.o.   MRN: [id number] History of Present Illness: Becky Henry is a 44 year old white female, in for well woman gyn exam and pap.[facility] elevated BP and to r/o a stroke.She had headache and blurred vision with vision loss in left side of left eye.She had CT and it was normal.Has no headache or vision loss now.But has PMS and spotting after ablation and family stress, cares for 45 year old mom with alzheimer's, dementia and schizophrenia.She has trouble falling asleep too. 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  Current Medications, Allergies, Past Medical History, Past Surgical History, Family History and Social History were [name].     Review of Systems: Patient denies any headaches, hearing loss, fatigue, blurred vision, shortness of breath, chest pain, abdominal pain, problems with bowel movements, urination, or intercourse(not having sex). No joint pain, see HPI for positives.    Physical Exam:BP 140/82 mmHg  Pulse 60  Ht 5' 2.25" (1.581 m)  Wt 144 lb (65.318 kg)  BMI 26.13 kg/m2 General:  Well developed, well nourished, no acute distress Skin:  Warm and dry,an Neck:  Midline trachea, normal thyroid, good ROM, no lymphadenopathy, CN 2-12 intact, no carotid bruits heard Lungs; Clear to auscultation bilaterally Breast:  No dominant palpable mass, retraction, or nipple discharge Cardiovascular: Regular rate and rhythm Abdomen:  Soft, non tender, no hepatosplenomegaly Pelvic:  External genitalia is normal in appearance, no lesions.  The vagina is normal in appearance, has tan discharge, without odor.Has clit ring. Urethra has no lesions or masses. The cervix is bulbous.Pap with HPV performed.  Uterus is felt to be normal size, shape, and contour.  No adnexal masses or tenderness noted.Bladder is non tender, no masses  felt. Rectal: Good sphincter tone, no polyps, or hemorrhoids felt.  Hemoccult negative. Extremities/musculoskeletal:  No swelling or varicosities noted, no clubbing or cyanosis Psych:  No mood changes, alert and cooperative,seems happy, but cconcerned Discussed get help with mom and start looking for places to care for her, take time for self, will get labs and prescribed BP med and meds for PMS and stress.Discussed if has vision loss go to ER.  Impression: Well woman gyn exam with pap Hypertension Stress at home PMS   Plan: Check CBC,CMP,TSH and lipids Rx microzide 12.5 mg #30 take 1 daily 11 refills Rx zoloft 50 mg #30 take 1 daily with 6 refills Follow up in 4 weeks Physical in 1 year Mammogram now and yearly Take time for self See eye doctor

## 2014-11-04 LAB — COMPREHENSIVE METABOLIC PANEL
A/G RATIO: 2.1 (ref 1.1–2.5)
ALBUMIN: 4.9 g/dL (ref 3.5–5.5)
ALT: 11 IU/L (ref 0–32)
AST: 13 IU/L (ref 0–40)
Alkaline Phosphatase: 54 IU/L (ref 39–117)
BUN / CREAT RATIO: 10 (ref 9–23)
BUN: 7 mg/dL (ref 6–24)
Bilirubin Total: 0.5 mg/dL (ref 0.0–1.2)
CHLORIDE: 98 mmol/L (ref 97–108)
CO2: 23 mmol/L (ref 18–29)
Calcium: 9.2 mg/dL (ref 8.7–10.2)
Creatinine, Ser: 0.72 mg/dL (ref 0.57–1.00)
GFR calc Af Amer: 117 mL/min/{1.73_m2} (ref >59)
GFR calc non Af Amer: 101 mL/min/{1.73_m2} (ref >59)
Globulin, Total: 2.3 g/dL (ref 1.5–4.5)
Glucose: 84 mg/dL (ref 65–99)
Potassium: 3.9 mmol/L (ref 3.5–5.2)
Sodium: 138 mmol/L (ref 134–144)
TOTAL PROTEIN: 7.2 g/dL (ref 6.0–8.5)

## 2014-11-04 LAB — CBC
HEMOGLOBIN: 13.7 g/dL (ref 11.1–15.9)
Hematocrit: 40.6 % (ref 34.0–46.6)
MCH: 31.6 pg (ref 26.6–33.0)
MCHC: 33.7 g/dL (ref 31.5–35.7)
MCV: 94 fL (ref 79–97)
PLATELETS: 308 10*3/uL (ref 150–379)
RBC: 4.34 x10E6/uL (ref 3.77–5.28)
RDW: 13.9 % (ref 12.3–15.4)
WBC: 7.5 10*3/uL (ref 3.4–10.8)

## 2014-11-04 LAB — LIPID PANEL
CHOLESTEROL TOTAL: 172 mg/dL (ref 100–199)
Chol/HDL Ratio: 3.5 ratio units (ref 0.0–4.4)
HDL: 49 mg/dL (ref >39)
LDL Calculated: 76 mg/dL (ref 0–99)
TRIGLYCERIDES: 235 mg/dL — AB (ref 0–149)
VLDL CHOLESTEROL CAL: 47 mg/dL — AB (ref 5–40)

## 2014-11-04 LAB — TSH: TSH: 0.988 u[IU]/mL (ref 0.450–4.500)

## 2014-11-05 LAB — CYTOLOGY - PAP

## 2014-11-06 ENCOUNTER — Telehealth: Payer: Self-pay | Admitting: Adult Health

## 2014-11-06 NOTE — Telephone Encounter (Signed)
Left a message that Victorino Dike left her lab results on her VM earlier, if she has any questions she can call us back on Monday.

## 2014-11-06 NOTE — Telephone Encounter (Signed)
Left message that labs were good, except trig. Slightly elevated, watch diet and increase activity, and pap normal with negative HPV but has yeast, can use OTC monistat if itches if not leave it alone

## 2014-12-01 ENCOUNTER — Ambulatory Visit (INDEPENDENT_AMBULATORY_CARE_PROVIDER_SITE_OTHER): Payer: BLUE CROSS/BLUE SHIELD | Admitting: Adult Health

## 2014-12-01 ENCOUNTER — Encounter: Payer: Self-pay | Admitting: Adult Health

## 2014-12-01 VITALS — BP 136/78 | HR 68 | Ht 62.25 in | Wt 143.0 lb

## 2014-12-01 DIAGNOSIS — I10 Essential (primary) hypertension: Secondary | ICD-10-CM

## 2014-12-01 DIAGNOSIS — Z638 Other specified problems related to primary support group: Secondary | ICD-10-CM

## 2014-12-01 DIAGNOSIS — F439 Reaction to severe stress, unspecified: Secondary | ICD-10-CM

## 2014-12-01 DIAGNOSIS — N943 Premenstrual tension syndrome: Secondary | ICD-10-CM

## 2014-12-01 MED ORDER — CYCLOBENZAPRINE HCL 5 MG PO TABS: 5.0000 mg | ORAL_TABLET | Freq: Every day | ORAL | Status: DC

## 2014-12-01 MED ORDER — BUSPIRONE HCL 5 MG PO TABS: 5.0000 mg | ORAL_TABLET | Freq: Two times a day (BID) | ORAL | Status: DC

## 2014-12-01 NOTE — Progress Notes (Signed)
Subjective:     Patient ID: Becky Henry, female   DOB: [birth year], 44 y.o.   MRN: [id number]  HPI Becky Henry is a 44 year old white female back in follow up of starting microzide and zoloft, and she feels a little better but thinks needs a med that can be taken in am and pm to hold her over, arms and legs achy at night, she goes up and down stairs and throws boxes all day.She has seen eye doctor and exam was normal.Still caring for her Mom, feels like never gets time for self, will even sit in car to look at the mail, to have a few minutes to her self.  Review of Systems Patient denies any headaches, hearing loss, fatigue, blurred vision, shortness of breath, chest pain, abdominal pain, problems with bowel movements, urination, or intercourse. No joint pain or mood swings. Reviewed past medical,surgical, social and family history. Reviewed medications and allergies.     Objective:   Physical Exam BP 136/78 mmHg  Pulse 68  Ht 5' 2.25" (1.581 m)  Wt 143 lb (64.864 kg)  BMI 25.95 kg/m2 Skin warm and dry.  Lungs: clear to ausculation bilaterally. Cardiovascular: regular rate and rhythm. BP is better.Reviewed labs and pap with her.  Discussed adding meds to zoloft and will try flexeril at hs.Face time 15 minutes, with 50% counseling.  Assessment:     Stress at home  Hypertension PMS    Plan:    Take time for self Continue zoloft 1 daily Rx buspar 5 mg 1 bid #60 with 6 refills Rx flexeril 5 mg #30 1 at hs with 1 refill Follow up in 4 weeks   Get mammogram ASAP

## 2014-12-01 NOTE — Patient Instructions (Signed)
Take zoloft daily Take buspar 1 bid Take flexeril at night Follow up in 4 weeks

## 2014-12-29 ENCOUNTER — Ambulatory Visit (INDEPENDENT_AMBULATORY_CARE_PROVIDER_SITE_OTHER): Payer: BLUE CROSS/BLUE SHIELD | Admitting: Adult Health

## 2014-12-29 ENCOUNTER — Encounter: Payer: Self-pay | Admitting: Adult Health

## 2014-12-29 VITALS — BP 136/86 | HR 64 | Ht 62.0 in | Wt 137.5 lb

## 2014-12-29 DIAGNOSIS — Z638 Other specified problems related to primary support group: Secondary | ICD-10-CM

## 2014-12-29 DIAGNOSIS — I10 Essential (primary) hypertension: Secondary | ICD-10-CM | POA: Diagnosis not present

## 2014-12-29 DIAGNOSIS — N943 Premenstrual tension syndrome: Secondary | ICD-10-CM

## 2014-12-29 DIAGNOSIS — F439 Reaction to severe stress, unspecified: Secondary | ICD-10-CM

## 2014-12-29 NOTE — Patient Instructions (Signed)
Get mammogram Continue meds Follow up in 3 months

## 2014-12-29 NOTE — Progress Notes (Signed)
Subjective:     Patient ID: Becky Henry, female   DOB: 22-Nov-1970, 44 y.o.   MRN: [id number]  HPI [name] is a 44 year old white female back in follow up of starting buspar with zoloft.She is smiling today and is feeling better,but has more gas recently.Mom has been in hospital so has had a break.  Review of Systems Patient denies any headaches, hearing loss, fatigue, blurred vision, shortness of breath, chest pain, abdominal pain, problems with bowel movements, urination, or intercourse. No joint pain or mood swings.See HPI. Reviewed past medical,surgical, social and family history. Reviewed medications and allergies.     Objective:   Physical Exam BP 136/86 mmHg  Pulse 64  Ht  (1.575 m)  Wt 137 lb 8 oz (62.37 kg)  BMI 25.14 kg/m2   Face time talking, about taking time for self and will continue current meds.  Assessment:     PMS Stress at home Hypertension     Plan:    Get mammogram Continue meds Follow up in 3 months

## 2015-01-29 ENCOUNTER — Other Ambulatory Visit: Payer: Self-pay | Admitting: Adult Health

## 2015-02-04 ENCOUNTER — Telehealth: Payer: Self-pay | Admitting: Adult Health

## 2015-02-04 NOTE — Telephone Encounter (Signed)
Voicemail not set up @ 10:55 am. [employer]SY

## 2015-02-05 NOTE — Telephone Encounter (Signed)
Spoke with pt letting her know JAG said she could [facility]. Pt to have papers sent to us. JSY

## 2015-02-10 ENCOUNTER — Telehealth: Payer: Self-pay | Admitting: Adult Health

## 2015-02-10 NOTE — Telephone Encounter (Signed)
Voice mail not set up @ 12:32 pm. JSY

## 2015-02-10 NOTE — Telephone Encounter (Signed)
Spoke with pt. Pt needs a note to return to work tomorrow, 02/11/15. Please fax note to [phone]. Thanks!! JSY

## 2015-02-10 NOTE — Telephone Encounter (Signed)
Note completed and faxed. JSY 

## 2015-02-17 ENCOUNTER — Telehealth: Payer: Self-pay | Admitting: *Deleted

## 2015-02-17 NOTE — Telephone Encounter (Signed)
Pt called stating her work note needs to say no restrictions. Note done and faxed to [phone]. JSY

## 2015-03-01 ENCOUNTER — Other Ambulatory Visit: Payer: Self-pay | Admitting: Adult Health

## 2015-04-20 ENCOUNTER — Ambulatory Visit: Payer: BLUE CROSS/BLUE SHIELD | Admitting: Adult Health

## 2015-05-31 ENCOUNTER — Other Ambulatory Visit: Payer: Self-pay | Admitting: Adult Health

## 2015-06-29 ENCOUNTER — Other Ambulatory Visit: Payer: Self-pay | Admitting: Adult Health

## 2015-08-26 ENCOUNTER — Other Ambulatory Visit: Payer: Self-pay | Admitting: Adult Health

## 2015-09-24 ENCOUNTER — Ambulatory Visit (INDEPENDENT_AMBULATORY_CARE_PROVIDER_SITE_OTHER): Payer: BLUE CROSS/BLUE SHIELD | Admitting: Adult Health

## 2015-09-24 ENCOUNTER — Encounter: Payer: Self-pay | Admitting: Adult Health

## 2015-09-24 VITALS — BP 140/70 | HR 78 | Ht 62.0 in | Wt 157.0 lb

## 2015-09-24 DIAGNOSIS — R1032 Left lower quadrant pain: Secondary | ICD-10-CM

## 2015-09-24 DIAGNOSIS — N939 Abnormal uterine and vaginal bleeding, unspecified: Secondary | ICD-10-CM

## 2015-09-24 HISTORY — DX: Abnormal uterine and vaginal bleeding, unspecified: N93.9

## 2015-09-24 HISTORY — DX: Left lower quadrant pain: R10.32

## 2015-09-24 NOTE — Progress Notes (Signed)
Subjective:     Patient ID: Lenis NoonMarie E Shouse, female   DOB: [birth year], 45 y.o.   MRN: [id number]  HPI [name] is a 45 year old white female in complaining of vaginal spotting for 1 month, she is sp ablation and has LLQ pain at times, she has had sex recently, she denies any discharge,itching or odor.  Review of Systems Patient denies any headaches, hearing loss, fatigue, blurred vision, shortness of breath, chest pain,  problems with bowel movements, urination, or intercourse. No joint pain or mood swings. See HPI for positives. Reviewed past medical,surgical, social and family history. Reviewed medications and allergies.     Objective:   Physical Exam BP 140/70 mmHg  Pulse 78  Ht 5\' 2"  (1.575 m)  Wt 157 lb (71.215 kg)  BMI 28.71 kg/m2  LMP  Skin warm and dry.Pelvic: external genitalia is normal in appearance no lesions, vagina: tan discharge without odor,urethra has no lesions or masses noted, cervix:smooth and bulbous,negative CMT uterus: normal size, shape and contour, mildly tender, no masses felt, adnexa: no masses, LLQ tenderness noted. Bladder is non tender and no masses felt. Abdomen is soft and non tender. GC/CHL obtained. Will get US to assess uterus. Discussed need to rule out STD, face time 15 mintues.    Assessment:     AUB LLQ pain     Plan:      GC/CHL sent Return in 1 week for gyn US Review handout on AUB

## 2015-09-24 NOTE — Patient Instructions (Signed)
Dysfunctional Uterine Bleeding Dysfunctional uterine bleeding is abnormal bleeding from the uterus. Dysfunctional uterine bleeding includes:  A period that comes earlier or later than usual.  A period that is lighter, heavier, or has blood clots.  Bleeding between periods.  Skipping one or more periods.  Bleeding after sexual intercourse.  Bleeding after menopause. HOME CARE INSTRUCTIONS  Pay attention to any changes in your symptoms. Follow these instructions to help with your condition: Eating  Eat well-balanced meals. Include foods that are high in iron, such as liver, meat, shellfish, green leafy vegetables, and eggs.  If you become constipated:  Drink plenty of water.  Eat fruits and vegetables that are high in water and fiber, such as spinach, carrots, raspberries, apples, and mango. Medicines  Take over-the-counter and prescription medicines only as told by your health care provider.  Do not change medicines without talking with your health care provider.  Aspirin or medicines that contain aspirin may make the bleeding worse. Do not take those medicines:  During the week before your period.  During your period.  If you were prescribed iron pills, take them as told by your health care provider. Iron pills help to replace iron that your body loses because of this condition. Activity  If you need to change your sanitary pad or tampon more than one time every 2 hours:  Lie in bed with your feet raised (elevated).  Place a cold pack on your lower abdomen.  Rest as much as possible until the bleeding stops or slows down.  Do not try to lose weight until the bleeding has stopped and your blood iron level is back to normal. Other Instructions  For two months, write down:  When your period starts.  When your period ends.  When any abnormal bleeding occurs.  What problems you notice.  Keep all follow up visits as told by your health care provider. This is  important. SEEK MEDICAL CARE IF:  You get light-headed or weak.  You have nausea and vomiting.  You cannot eat or drink without vomiting.  You feel dizzy or have diarrhea while you are taking medicines.  You are taking birth control pills or hormones, and you want to change them or stop taking them. SEEK IMMEDIATE MEDICAL CARE IF:  You develop a fever or chills.  You need to change your sanitary pad or tampon more than one time per hour.  Your bleeding becomes heavier, or your flow contains clots more often.  You develop pain in your abdomen.  You lose consciousness.  You develop a rash.   This information is not intended to replace advice given to you by your health care provider. Make sure you discuss any questions you have with your health care provider.   Document Released: 03/31/2000 Document Revised: 12/23/2014 Document Reviewed: 06/29/2014 Elsevier Interactive Patient Education [employer]2016 Elsevier Inc. Return in 1 week for [place]

## 2015-09-28 LAB — GC/CHLAMYDIA PROBE AMP
Chlamydia trachomatis, NAA: NEGATIVE
Neisseria gonorrhoeae by PCR: NEGATIVE

## 2015-09-30 ENCOUNTER — Telehealth: Payer: Self-pay | Admitting: Adult Health

## 2015-10-01 ENCOUNTER — Other Ambulatory Visit: Payer: BLUE CROSS/BLUE SHIELD

## 2015-10-01 NOTE — Telephone Encounter (Signed)
Pt aware GC/CHL both negative  

## 2015-11-22 ENCOUNTER — Other Ambulatory Visit: Payer: Self-pay | Admitting: Adult Health

## 2015-12-06 ENCOUNTER — Telehealth: Payer: Self-pay | Admitting: Adult Health

## 2015-12-06 NOTE — Telephone Encounter (Signed)
Pt called stating that she has a lump on her breast and it is hurting. Pt states she would like to know what it is and is it normal for her age. Please contact pt

## 2015-12-06 NOTE — Telephone Encounter (Signed)
Spoke with pt. Pt has a lump on breast and it's painful. I advised she would need to be seen. Pt voiced understanding. Call was transferred to front desk for appt. JSY

## 2015-12-07 ENCOUNTER — Ambulatory Visit (INDEPENDENT_AMBULATORY_CARE_PROVIDER_SITE_OTHER): Payer: BLUE CROSS/BLUE SHIELD | Admitting: Adult Health

## 2015-12-07 ENCOUNTER — Encounter: Payer: Self-pay | Admitting: Adult Health

## 2015-12-07 VITALS — BP 140/80 | HR 84 | Ht 62.0 in | Wt 159.8 lb

## 2015-12-07 DIAGNOSIS — N61 Mastitis without abscess: Secondary | ICD-10-CM

## 2015-12-07 DIAGNOSIS — N6452 Nipple discharge: Secondary | ICD-10-CM | POA: Diagnosis not present

## 2015-12-07 MED ORDER — AMOXICILLIN-POT CLAVULANATE 875-125 MG PO TABS: 1.0000 | ORAL_TABLET | Freq: Two times a day (BID) | ORAL | 0 refills | Status: DC

## 2015-12-07 MED ORDER — IBUPROFEN 800 MG PO TABS: 800.0000 mg | ORAL_TABLET | Freq: Three times a day (TID) | ORAL | 1 refills | Status: DC | PRN

## 2015-12-07 NOTE — Patient Instructions (Signed)
Mastitis Mastitis is inflammation of the breast tissue. It occurs most often in women who are breastfeeding, but it can also affect other women, and even sometimes men. CAUSES  Mastitis is usually caused by a bacterial infection. Bacteria enter the breast tissue through cuts or openings in the skin. Typically, this occurs with breastfeeding because of cracked or irritated skin. Sometimes, it can occur even when there is no opening in the skin. It can be associated with plugged milk (lactiferous) ducts. Nipple piercing can also lead to mastitis. Also, some forms of breast cancer can cause mastitis. SIGNS AND SYMPTOMS   Swelling, redness, tenderness, and pain in an area of the breast.  Swelling of the glands under the arm on the same side.  Fever. If an infection is allowed to progress, a collection of pus (abscess) may develop. DIAGNOSIS  Your health care provider can usually diagnose mastitis based on your symptoms and a physical exam. Tests may be done to help confirm the diagnosis. These may include:   Removal of pus from the breast by applying pressure to the area. This pus can be examined in the lab to determine which bacteria are present. If an abscess has developed, the fluid in the abscess can be removed with a needle. This can also be used to confirm the diagnosis and determine the bacteria present. In most cases, pus will not be present.  Blood tests to determine if your body is fighting a bacterial infection.  Mammogram or ultrasound tests to rule out other problems or diseases. TREATMENT  Antibiotic medicine is used to treat a bacterial infection. Your health care provider will determine which bacteria are most likely causing the infection and will select an appropriate antibiotic. This is sometimes changed based on the results of tests performed to identify the bacteria, or if there is no response to the antibiotic selected. Antibiotics are usually given by mouth. You may also be  given medicine for pain. Mastitis that occurs with breastfeeding will sometimes go away on its own, so your health care provider may choose to wait 24 hours after first seeing you to decide whether a prescription medicine is needed. HOME CARE INSTRUCTIONS   Only take over-the-counter or prescription medicines for pain, fever, or discomfort as directed by your health care provider.  If your health care provider prescribed an antibiotic, take the medicine as directed. Make sure you finish it even if you start to feel better.  Do not wear a tight or underwire bra. Wear a soft, supportive bra.  Increase your fluid intake, especially if you have a fever.  Women who are breastfeeding should follow these instructions:  Continue to empty the breast. Your health care provider can tell you whether this milk is safe for your infant or needs to be thrown out. You may be told to stop nursing until your health care provider thinks it is safe for your baby. Use a breast pump if you are advised to stop nursing.  Keep your nipples clean and dry.  Empty the first breast completely before going to the other breast. If your baby is not emptying your breasts completely for some reason, use a breast pump to empty your breasts.  If you go back to work, pump your breasts while at work to stay in time with your nursing schedule.  Avoid allowing your breasts to become overly filled with milk (engorged). SEEK MEDICAL CARE IF:   You have pus-like discharge from the breast.  Your symptoms do not   improve with the treatment prescribed by your health care provider within 2 days. SEEK IMMEDIATE MEDICAL CARE IF:   Your pain and swelling are getting worse.  You have pain that is not controlled with medicine.  You have a red line extending from the breast toward your armpit.  You have a fever or persistent symptoms for more than 2-3 days.  You have a fever and your symptoms suddenly get worse.   This information  is not intended to replace advice given to you by your health care provider. Make sure you discuss any questions you have with your health care provider.   Document Released: 04/03/2005 Document Revised: 04/08/2013 Document Reviewed: 11/01/2012 Elsevier Interactive Patient Education 2016 Elsevier Inc. Take augmentin bid for 10 days Can use ice pack  Follow  upp next week as scheduled

## 2015-12-07 NOTE — Progress Notes (Signed)
Subjective:     Patient ID: Becky Henry, female   DOB: 1971/04/01, 45 y.o.   MRN: [id number]  HPI Becky Henry is a 45 year old white female in complaining of breast lump and pain since Sunday night. Has redness now.   Review of Systems +left breast lump and pain  Reviewed past medical,surgical, social and family history. Reviewed medications and allergies.     Objective:   Physical Exam BP 140/80   Pulse 84   Ht 5\' 2"  (1.575 m)   Wt 159 lb 12.8 oz (72.5 kg)   BMI 29.23 kg/m    Skin warm and dry,  Breasts:no dominate palpable mass,or retraction on right but has white milky discharge from about 3 sites, on left has redness and warm to touch and firmness about 4 x 6 cm at 3 o'clock, 2 fb from nipple, no retraction or nipple discharge.Will prescribe Augmentin and recheck next week at physical will check TSH and prolactin then before exam and will get mammogram and US if persists.She had not noticed any nipple discharge.  Face time 15 minutes.    Assessment:     Mastitis, left, acute  Discharge from right nipple     Plan:     Rx Augmentin 875-125 #20 take 1 bid x 10 days Rx motrin 800 mg #60 take 1 every 8 hours prn with 1 refill Review handout on mastitis Can use ice pack for comfort  Return next week as scheduled for pap and physical will check TSH and prolactin before exam

## 2015-12-10 ENCOUNTER — Telehealth: Payer: Self-pay | Admitting: Adult Health

## 2015-12-10 NOTE — Telephone Encounter (Signed)
Keep taking augmentin and can take 2 of microzides and keep appt Tuesday

## 2015-12-14 ENCOUNTER — Ambulatory Visit (INDEPENDENT_AMBULATORY_CARE_PROVIDER_SITE_OTHER): Payer: BLUE CROSS/BLUE SHIELD | Admitting: Adult Health

## 2015-12-14 ENCOUNTER — Encounter: Payer: Self-pay | Admitting: Adult Health

## 2015-12-14 VITALS — BP 138/78 | HR 80 | Ht 62.0 in | Wt 156.5 lb

## 2015-12-14 DIAGNOSIS — N6452 Nipple discharge: Secondary | ICD-10-CM | POA: Diagnosis not present

## 2015-12-14 DIAGNOSIS — N63 Unspecified lump in breast: Secondary | ICD-10-CM | POA: Diagnosis not present

## 2015-12-14 DIAGNOSIS — Z1211 Encounter for screening for malignant neoplasm of colon: Secondary | ICD-10-CM

## 2015-12-14 DIAGNOSIS — N6320 Unspecified lump in the left breast, unspecified quadrant: Secondary | ICD-10-CM

## 2015-12-14 DIAGNOSIS — Z638 Other specified problems related to primary support group: Secondary | ICD-10-CM | POA: Diagnosis not present

## 2015-12-14 DIAGNOSIS — I10 Essential (primary) hypertension: Secondary | ICD-10-CM

## 2015-12-14 DIAGNOSIS — R0602 Shortness of breath: Secondary | ICD-10-CM

## 2015-12-14 DIAGNOSIS — Z01411 Encounter for gynecological examination (general) (routine) with abnormal findings: Secondary | ICD-10-CM

## 2015-12-14 DIAGNOSIS — Z01419 Encounter for gynecological examination (general) (routine) without abnormal findings: Secondary | ICD-10-CM

## 2015-12-14 DIAGNOSIS — R5383 Other fatigue: Secondary | ICD-10-CM

## 2015-12-14 DIAGNOSIS — F439 Reaction to severe stress, unspecified: Secondary | ICD-10-CM

## 2015-12-14 LAB — HEMOCCULT GUIAC POC 1CARD (OFFICE): Fecal Occult Blood, POC: NEGATIVE

## 2015-12-14 MED ORDER — CYCLOBENZAPRINE HCL 5 MG PO TABS: ORAL_TABLET | ORAL | 3 refills | Status: DC

## 2015-12-14 MED ORDER — VALACYCLOVIR HCL 1 G PO TABS: 1000.0000 mg | ORAL_TABLET | Freq: Every day | ORAL | 12 refills | Status: DC

## 2015-12-14 MED ORDER — SERTRALINE HCL 50 MG PO TABS: 50.0000 mg | ORAL_TABLET | Freq: Every day | ORAL | 3 refills | Status: DC

## 2015-12-14 MED ORDER — HYDROCHLOROTHIAZIDE 12.5 MG PO CAPS: 12.5000 mg | ORAL_CAPSULE | Freq: Every day | ORAL | 11 refills | Status: DC

## 2015-12-14 NOTE — Addendum Note (Signed)
Addended by: Colen DarlingYOUNG, JANET S on: 12/14/2015 03:54 PM   Modules accepted: Orders

## 2015-12-14 NOTE — Patient Instructions (Signed)
[facility] Physical in 1 year Pap in 2019 Will talk next week

## 2015-12-14 NOTE — Progress Notes (Signed)
Patient ID: Becky Henry, female   DOB: 05-15-1970, 45 y.o.   MRN: [id number] History of Present Illness: Becky Henry is a 45 year old white female in for well woman gyn exam, she had a normal pap with negative HPV 11/03/14.She was treated last week for mastitis in left breast and had nipple discharge on the right.She is complaining of shortness of breath with exertion and no energy.She was wondering if related to mastitis and antibiotics.   Current Medications, Allergies, Past Medical History, Past Surgical History, Family History and Social History were reviewed in [employer] record.     Review of Systems: Patient denies any headaches, hearing loss,  blurred vision, chest pain, abdominal pain, problems with bowel movements, urination, or intercourse. No joint pain or mood swings.See HPI for positives.    Physical Exam:BP 138/78 (BP Location: Left Arm, Patient Position: Sitting, Cuff Size: Normal)   Pulse 80   Ht 5\' 2"  (1.575 m)   Wt 156 lb 8 oz (71 kg)   BMI 28.62 kg/m  General:  Well developed, well nourished, no acute distress Skin:  Warm and dry Neck:  Midline trachea, normal thyroid, good ROM, no lymphadenopathy Lungs; Clear to auscultation bilaterally Breast:  No dominant palpable mass, or  Retraction,on the right, but has white milky discharge from several sites,placed on slide will send to pathology, on the left no retraction or nipple discharge but has oval mass at 9 0'clock near areola, that is firm and non mobile Cardiovascular: Regular rate and rhythm Abdomen:  Soft, non tender, no hepatosplenomegaly Pelvic:  External genitalia is normal in appearance, no lesions. She does have a clit ring. The vagina is normal in appearance. Urethra has no lesions or masses. The cervix is smooth.  Uterus is felt to be normal size, shape, and contour.  No adnexal masses or tenderness noted.Bladder is non tender, no masses felt. Rectal: Good sphincter tone, no polyps, or  hemorrhoids felt.  Hemoccult negative. Extremities/musculoskeletal:  No swelling or varicosities noted, no clubbing or cyanosis Psych:  No mood changes, alert and cooperative,seems happy   Impression: Well woman exam with routine gynecological exam - Plan: CBC, Comprehensive metabolic panel, Lipid panel, POCT occult blood stool, Hemoglobin A1c, VITAMIN D 25 Hydroxy (Vit-D Deficiency, Fractures)  Breast discharge - Plan: TSH, Prolactin, US BREAST LTD UNI RIGHT INC AXILLA, MM DIAG BREAST TOMO BILATERAL  Breast mass, left - Plan: US BREAST LTD UNI LEFT INC AXILLA, MM DIAG BREAST TOMO BILATERAL  Essential hypertension  No energy - Plan: CBC, Comprehensive metabolic panel  Short of breath on exertion - Plan: CBC  Stress at home     Plan: Diagnostic mammogram, bilateral and left and right [place] scheduled 9/5 at 1:40 pm [facility] TSH and prolactin before breast exam Check CBC,CMP,lipids, A1c and vitamin D Nipple discharge slide sent to pathology Physical in 1 year, pap in 2019 Will talk when labs back, if normal, may get cardiology referral Meds ordered this encounter  Medications  . valACYclovir (VALTREX) 1000 MG tablet    Sig: Take 1 tablet (1,000 mg total) by mouth daily.    Dispense:  30 tablet    Refill:  12    Order Specific Question:   Supervising Provider    Answer:   Despina Hidden, LUTHER H [[id number]]  . hydrochlorothiazide (MICROZIDE) 12.5 MG capsule    Sig: Take 1 capsule (12.5 mg total) by mouth daily.    Dispense:  30 capsule    Refill:  11    Order Specific Question:   Supervising Provider    Answer:   Despina HiddenEURE, LUTHER H [[id number]]  . cyclobenzaprine (FLEXERIL) 5 MG tablet    Sig: TAKE ONE TABLET BY MOUTH AT BEDTIME PRN    Dispense:  30 tablet    Refill:  3    Order Specific Question:   Supervising Provider    Answer:   Despina HiddenEURE, LUTHER H [[id number]]  . sertraline (ZOLOFT) 50 MG tablet    Sig: Take 1 tablet (50 mg total) by mouth daily.    Dispense:  30 tablet    Refill:  3     Order Specific Question:   Supervising Provider    Answer:   Duane LopeEURE, LUTHER H [[id number]]

## 2015-12-15 ENCOUNTER — Other Ambulatory Visit (HOSPITAL_COMMUNITY)
Admission: RE | Admit: 2015-12-15 | Discharge: 2015-12-15 | Disposition: A | Discharge status: Home / Self Care | Payer: BLUE CROSS/BLUE SHIELD | Source: Ambulatory Visit | Attending: Adult Health | Admitting: Adult Health

## 2015-12-15 DIAGNOSIS — N6452 Nipple discharge: Secondary | ICD-10-CM | POA: Diagnosis present

## 2015-12-15 LAB — COMPREHENSIVE METABOLIC PANEL
A/G RATIO: 1.7 (ref 1.2–2.2)
ALT: 14 IU/L (ref 0–32)
AST: 12 IU/L (ref 0–40)
Albumin: 4.5 g/dL (ref 3.5–5.5)
Alkaline Phosphatase: 68 IU/L (ref 39–117)
BILIRUBIN TOTAL: 0.3 mg/dL (ref 0.0–1.2)
BUN/Creatinine Ratio: 12 (ref 9–23)
BUN: 9 mg/dL (ref 6–24)
CHLORIDE: 99 mmol/L (ref 96–106)
CO2: 26 mmol/L (ref 18–29)
Calcium: 9.3 mg/dL (ref 8.7–10.2)
Creatinine, Ser: 0.76 mg/dL (ref 0.57–1.00)
GFR calc Af Amer: 109 mL/min/{1.73_m2} (ref >59)
GFR calc non Af Amer: 94 mL/min/{1.73_m2} (ref >59)
Globulin, Total: 2.7 g/dL (ref 1.5–4.5)
Glucose: 106 mg/dL — ABNORMAL HIGH (ref 65–99)
Potassium: 4.1 mmol/L (ref 3.5–5.2)
Sodium: 140 mmol/L (ref 134–144)
TOTAL PROTEIN: 7.2 g/dL (ref 6.0–8.5)

## 2015-12-15 LAB — LIPID PANEL
CHOL/HDL RATIO: 3.7 ratio (ref 0.0–4.4)
Cholesterol, Total: 163 mg/dL (ref 100–199)
HDL: 44 mg/dL (ref >39)
LDL Calculated: 79 mg/dL (ref 0–99)
Triglycerides: 199 mg/dL — ABNORMAL HIGH (ref 0–149)
VLDL CHOLESTEROL CAL: 40 mg/dL (ref 5–40)

## 2015-12-15 LAB — CBC
HEMATOCRIT: 40.1 % (ref 34.0–46.6)
Hemoglobin: 13.4 g/dL (ref 11.1–15.9)
MCH: 30.7 pg (ref 26.6–33.0)
MCHC: 33.4 g/dL (ref 31.5–35.7)
MCV: 92 fL (ref 79–97)
Platelets: 265 10*3/uL (ref 150–379)
RBC: 4.36 x10E6/uL (ref 3.77–5.28)
RDW: 13.2 % (ref 12.3–15.4)
WBC: 9.9 10*3/uL (ref 3.4–10.8)

## 2015-12-15 LAB — TSH: TSH: 0.673 u[IU]/mL (ref 0.450–4.500)

## 2015-12-15 LAB — PROLACTIN: Prolactin: 15 ng/mL (ref 4.8–23.3)

## 2015-12-15 LAB — HEMOGLOBIN A1C
ESTIMATED AVERAGE GLUCOSE: 91 mg/dL
HEMOGLOBIN A1C: 4.8 % (ref 4.8–5.6)

## 2015-12-15 LAB — VITAMIN D 25 HYDROXY (VIT D DEFICIENCY, FRACTURES): Vit D, 25-Hydroxy: 30.3 ng/mL (ref 30.0–100.0)

## 2015-12-15 NOTE — Addendum Note (Signed)
Addended by: Colen DarlingYOUNG, Thompson Mckim S on: 12/15/2015 12:42 PM   Modules accepted: Orders

## 2015-12-21 ENCOUNTER — Ambulatory Visit (HOSPITAL_COMMUNITY)
Admission: RE | Admit: 2015-12-21 | Discharge: 2015-12-21 | Disposition: A | Discharge status: Home / Self Care | Payer: BLUE CROSS/BLUE SHIELD | Source: Ambulatory Visit | Attending: Adult Health | Admitting: Adult Health

## 2015-12-21 DIAGNOSIS — N63 Unspecified lump in breast: Secondary | ICD-10-CM | POA: Insufficient documentation

## 2015-12-21 DIAGNOSIS — R928 Other abnormal and inconclusive findings on diagnostic imaging of breast: Secondary | ICD-10-CM | POA: Insufficient documentation

## 2015-12-21 DIAGNOSIS — N6320 Unspecified lump in the left breast, unspecified quadrant: Secondary | ICD-10-CM

## 2015-12-21 DIAGNOSIS — N6452 Nipple discharge: Secondary | ICD-10-CM | POA: Diagnosis present

## 2015-12-22 ENCOUNTER — Telehealth: Payer: Self-pay | Admitting: Adult Health

## 2015-12-22 NOTE — Telephone Encounter (Signed)
Pt aware of labs and that breast discharge path was no malignancy,and that mammogram and US showed cysts

## 2016-01-06 ENCOUNTER — Other Ambulatory Visit: Payer: BLUE CROSS/BLUE SHIELD | Admitting: Adult Health

## 2016-02-08 ENCOUNTER — Telehealth: Payer: Self-pay | Admitting: Adult Health

## 2016-02-08 NOTE — Telephone Encounter (Signed)
Pt states her blood pressure continues to be elevated while taking 3 blood pressure medication. Pt given an appt with Cyril MourningJennifer Griffin, NP.

## 2016-02-11 ENCOUNTER — Ambulatory Visit (INDEPENDENT_AMBULATORY_CARE_PROVIDER_SITE_OTHER): Payer: BLUE CROSS/BLUE SHIELD | Admitting: Adult Health

## 2016-02-11 ENCOUNTER — Encounter: Payer: Self-pay | Admitting: Adult Health

## 2016-02-11 VITALS — BP 140/80 | HR 78 | Ht 62.0 in | Wt 159.0 lb

## 2016-02-11 DIAGNOSIS — R232 Flushing: Secondary | ICD-10-CM

## 2016-02-11 DIAGNOSIS — I10 Essential (primary) hypertension: Secondary | ICD-10-CM | POA: Diagnosis not present

## 2016-02-11 DIAGNOSIS — R0602 Shortness of breath: Secondary | ICD-10-CM

## 2016-02-11 MED ORDER — LOSARTAN POTASSIUM 50 MG PO TABS: 50.0000 mg | ORAL_TABLET | Freq: Every day | ORAL | 3 refills | Status: DC

## 2016-02-11 NOTE — Progress Notes (Signed)
Subjective:     Patient ID: Lenis NoonMarie E Babinski, female   DOB: 10/24/1970, 45 y.o.   MRN: [id number]  HPI [name] is a 45 year old white female in for BP check and she says it is randomly high and she will have hot flash then.She says left arm and hand feel numb and left side of neck and head has times they feel numb too.She has had ringing in ears for about a week at times too.Still has shortness of breath with exertion  Review of Systems  +hot flashes when BP up Left arm and hand numb Ringing in ears at times Shortness of breath with exertion  Reviewed past medical,surgical, social and family history. Reviewed medications and allergies.     Objective:   Physical Exam BP 140/80 (BP Location: Right Arm, Patient Position: Sitting, Cuff Size: Normal)   Pulse 78   Ht 5\' 2"  (1.575 m)   Wt 159 lb (72.1 kg)   BMI 29.08 kg/m  Skin warm and dry. Lungs: clear to ausculation bilaterally. Cardiovascular: regular rate and rhythm.Ears clear with pearl gray TM, has good range of motion left arm and strength. PHQ 2 score 0. Labs were normal in August.  Will add losartan to microzide and refer to Dr Diona BrownerMcDowell.Do not over take microzide   Face time 15 minutes with 50% counseling and coordinating care.   Assessment:     1. Essential hypertension   2. Short of breath on exertion   3. Hot flashes       Plan:     Rx losartan 50 mg #30 take 1 daily with 3 refills Continue Microzide Return in 10 days for BP check Referred to Dr Diona BrownerMcDowell

## 2016-02-11 NOTE — Patient Instructions (Signed)
Continue microzide Take losartan now Referred to Dr Diona BrownerMcDowell Return in follow in 10 days

## 2016-02-17 ENCOUNTER — Telehealth: Payer: Self-pay | Admitting: *Deleted

## 2016-02-21 ENCOUNTER — Ambulatory Visit: Payer: BLUE CROSS/BLUE SHIELD | Admitting: Adult Health

## 2016-02-21 NOTE — Telephone Encounter (Signed)
Unable to reach pt x3 attempts 

## 2016-03-01 ENCOUNTER — Encounter: Payer: Self-pay | Admitting: Cardiology

## 2016-04-17 ENCOUNTER — Other Ambulatory Visit: Payer: Self-pay | Admitting: Adult Health

## 2016-06-15 ENCOUNTER — Other Ambulatory Visit: Payer: Self-pay | Admitting: Adult Health

## 2016-06-19 ENCOUNTER — Other Ambulatory Visit: Payer: Self-pay | Admitting: Adult Health

## 2016-06-19 ENCOUNTER — Telehealth: Payer: Self-pay | Admitting: *Deleted

## 2016-06-19 NOTE — Telephone Encounter (Signed)
Patient requesting prescription for allergies. Zyrtec not working.

## 2016-06-20 NOTE — Telephone Encounter (Signed)
Left message to call me back.

## 2016-09-07 ENCOUNTER — Other Ambulatory Visit: Payer: Self-pay | Admitting: *Deleted

## 2016-09-07 MED ORDER — VALACYCLOVIR HCL 1 G PO TABS: 1000.0000 mg | ORAL_TABLET | Freq: Every day | ORAL | 12 refills | Status: DC

## 2016-09-07 MED ORDER — LOSARTAN POTASSIUM 50 MG PO TABS: 50.0000 mg | ORAL_TABLET | Freq: Every day | ORAL | 3 refills | Status: DC

## 2016-09-07 MED ORDER — CYCLOBENZAPRINE HCL 5 MG PO TABS: ORAL_TABLET | ORAL | 3 refills | Status: DC

## 2016-09-07 MED ORDER — IBUPROFEN 800 MG PO TABS: 800.0000 mg | ORAL_TABLET | Freq: Three times a day (TID) | ORAL | 1 refills | Status: DC | PRN

## 2016-09-07 MED ORDER — HYDROCHLOROTHIAZIDE 12.5 MG PO CAPS: 12.5000 mg | ORAL_CAPSULE | Freq: Every day | ORAL | 11 refills | Status: DC

## 2016-09-14 ENCOUNTER — Other Ambulatory Visit: Payer: Self-pay | Admitting: *Deleted

## 2016-09-14 MED ORDER — LOSARTAN POTASSIUM 50 MG PO TABS: 50.0000 mg | ORAL_TABLET | Freq: Every day | ORAL | 0 refills | Status: DC

## 2016-09-14 MED ORDER — IBUPROFEN 800 MG PO TABS: 800.0000 mg | ORAL_TABLET | Freq: Three times a day (TID) | ORAL | 0 refills | Status: DC | PRN

## 2016-09-14 MED ORDER — VALACYCLOVIR HCL 1 G PO TABS: 1000.0000 mg | ORAL_TABLET | Freq: Every day | ORAL | 0 refills | Status: DC

## 2016-09-14 MED ORDER — CYCLOBENZAPRINE HCL 5 MG PO TABS: ORAL_TABLET | ORAL | 0 refills | Status: DC

## 2016-09-14 MED ORDER — HYDROCHLOROTHIAZIDE 12.5 MG PO CAPS: 12.5000 mg | ORAL_CAPSULE | Freq: Every day | ORAL | 0 refills | Status: DC

## 2016-10-30 ENCOUNTER — Encounter: Payer: Self-pay | Admitting: *Deleted

## 2016-10-30 ENCOUNTER — Telehealth: Payer: Self-pay | Admitting: Adult Health

## 2016-10-30 NOTE — Telephone Encounter (Signed)
Pt called stating that she does her medication refill online and she does not know who to call for the refill so she would like to leave a message for Victorino DikeJennifer to refill her medication. Please contact pt

## 2016-10-30 NOTE — Telephone Encounter (Signed)
Mailbox full. Unable to leave message. Will send mychart message.

## 2016-11-02 ENCOUNTER — Telehealth: Payer: Self-pay | Admitting: Adult Health

## 2016-11-02 NOTE — Telephone Encounter (Signed)
Attempted to return patient's call but mailbox full. Patient needs appointment before meds will be ref[facility]

## 2016-11-02 NOTE — Telephone Encounter (Signed)
Patient states that her pharmacy as sent over a request for a refill of her blood pressure medication, Pt uses a pharmacy that requires a 3 month supplies in order for them to pay. Please contact pt

## 2016-11-03 ENCOUNTER — Telehealth: Payer: Self-pay | Admitting: Adult Health

## 2016-11-03 MED ORDER — LOSARTAN POTASSIUM 50 MG PO TABS: 50.0000 mg | ORAL_TABLET | Freq: Every day | ORAL | 0 refills | Status: DC

## 2016-11-03 MED ORDER — HYDROCHLOROTHIAZIDE 12.5 MG PO CAPS: 12.5000 mg | ORAL_CAPSULE | Freq: Every day | ORAL | 0 refills | Status: DC

## 2016-11-03 MED ORDER — VALACYCLOVIR HCL 1 G PO TABS: 1000.0000 mg | ORAL_TABLET | Freq: Every day | ORAL | 0 refills | Status: DC

## 2016-11-03 NOTE — Telephone Encounter (Signed)
Pt aware 1 refill on BP meds valtrex sent to [facility]

## 2016-11-13 ENCOUNTER — Telehealth: Payer: Self-pay | Admitting: *Deleted

## 2016-11-13 MED ORDER — LOSARTAN POTASSIUM 50 MG PO TABS: 50.0000 mg | ORAL_TABLET | Freq: Every day | ORAL | 1 refills | Status: DC

## 2016-11-13 NOTE — Telephone Encounter (Signed)
Pt has appt in August, and needs refill on losartan, sent x1

## 2016-11-22 ENCOUNTER — Telehealth: Payer: Self-pay | Admitting: Adult Health

## 2016-11-22 NOTE — Telephone Encounter (Signed)
Mail box full @ 1:00 pm. Unable to leave message. JSY

## 2016-11-22 NOTE — Telephone Encounter (Signed)
Patient Called stating that Becky DikeJennifer was suppose to call in some blood pressure medication and she called her pharmacy and they state that they still do not have it. Please contact pt

## 2016-11-23 NOTE — Telephone Encounter (Signed)
Number disconnected @ 11:33 am. No other number available to call. Encounter closed. JSY

## 2016-12-14 ENCOUNTER — Other Ambulatory Visit (HOSPITAL_COMMUNITY)
Admission: RE | Admit: 2016-12-14 | Discharge: 2016-12-14 | Disposition: A | Discharge status: Home / Self Care | Payer: BLUE CROSS/BLUE SHIELD | Source: Ambulatory Visit | Attending: Adult Health | Admitting: Adult Health

## 2016-12-14 ENCOUNTER — Ambulatory Visit (INDEPENDENT_AMBULATORY_CARE_PROVIDER_SITE_OTHER): Payer: BLUE CROSS/BLUE SHIELD | Admitting: Adult Health

## 2016-12-14 ENCOUNTER — Encounter: Payer: Self-pay | Admitting: Adult Health

## 2016-12-14 VITALS — BP 110/60 | HR 80 | Ht 62.0 in | Wt 148.0 lb

## 2016-12-14 DIAGNOSIS — Z1212 Encounter for screening for malignant neoplasm of rectum: Secondary | ICD-10-CM | POA: Diagnosis not present

## 2016-12-14 DIAGNOSIS — N6452 Nipple discharge: Secondary | ICD-10-CM | POA: Insufficient documentation

## 2016-12-14 DIAGNOSIS — Z79890 Hormone replacement therapy (postmenopausal): Secondary | ICD-10-CM | POA: Diagnosis not present

## 2016-12-14 DIAGNOSIS — Z01411 Encounter for gynecological examination (general) (routine) with abnormal findings: Secondary | ICD-10-CM

## 2016-12-14 DIAGNOSIS — R232 Flushing: Secondary | ICD-10-CM | POA: Insufficient documentation

## 2016-12-14 DIAGNOSIS — Z13220 Encounter for screening for lipoid disorders: Secondary | ICD-10-CM | POA: Diagnosis not present

## 2016-12-14 DIAGNOSIS — Z1321 Encounter for screening for nutritional disorder: Secondary | ICD-10-CM | POA: Insufficient documentation

## 2016-12-14 DIAGNOSIS — Z131 Encounter for screening for diabetes mellitus: Secondary | ICD-10-CM

## 2016-12-14 DIAGNOSIS — Z566 Other physical and mental strain related to work: Secondary | ICD-10-CM | POA: Insufficient documentation

## 2016-12-14 DIAGNOSIS — I10 Essential (primary) hypertension: Secondary | ICD-10-CM

## 2016-12-14 DIAGNOSIS — R5383 Other fatigue: Secondary | ICD-10-CM

## 2016-12-14 DIAGNOSIS — Z01419 Encounter for gynecological examination (general) (routine) without abnormal findings: Secondary | ICD-10-CM | POA: Insufficient documentation

## 2016-12-14 DIAGNOSIS — Z1211 Encounter for screening for malignant neoplasm of colon: Secondary | ICD-10-CM | POA: Insufficient documentation

## 2016-12-14 LAB — HEMOCCULT GUIAC POC 1CARD (OFFICE): Fecal Occult Blood, POC: NEGATIVE

## 2016-12-14 MED ORDER — HYDROCHLOROTHIAZIDE 12.5 MG PO CAPS: 12.5000 mg | ORAL_CAPSULE | Freq: Every day | ORAL | 0 refills | Status: DC

## 2016-12-14 MED ORDER — CYCLOBENZAPRINE HCL 5 MG PO TABS: ORAL_TABLET | ORAL | 1 refills | Status: DC

## 2016-12-14 MED ORDER — VALACYCLOVIR HCL 1 G PO TABS: 1000.0000 mg | ORAL_TABLET | Freq: Every day | ORAL | 4 refills | Status: DC

## 2016-12-14 MED ORDER — CONJ ESTROGENS-BAZEDOXIFENE 0.45-20 MG PO TABS: ORAL_TABLET | ORAL | 0 refills | Status: DC

## 2016-12-14 MED ORDER — LOSARTAN POTASSIUM 50 MG PO TABS: 50.0000 mg | ORAL_TABLET | Freq: Every day | ORAL | 4 refills | Status: DC

## 2016-12-14 MED ORDER — IBUPROFEN 800 MG PO TABS: 800.0000 mg | ORAL_TABLET | Freq: Three times a day (TID) | ORAL | 1 refills | Status: DC | PRN

## 2016-12-14 NOTE — Addendum Note (Signed)
Addended by: Federico FlakeNES, Santi Troung A on: 12/14/2016 09:39 AM   Modules accepted: Orders

## 2016-12-14 NOTE — Patient Instructions (Addendum)
Physical in 1 year Pap in 3 years if normal Mammogram yearly Call with F/U on HRT

## 2016-12-14 NOTE — Progress Notes (Signed)
Patient ID: Becky Henry, female   DOB: 03/12/1971, 46 y.o.   MRN: [id number] History of Present Illness: Becky Henry is a 46 year old white female in for well woman gyn exam and pap.Mom died, still has toddler, working long hours.   Current Medications, Allergies, Past Medical History, Past Surgical History, Family History and Social History were reviewed in [name] electronic medical record.     Review of Systems:  Patient denies any headaches, hearing loss,  blurred vision, shortness of breath, chest pain, abdominal pain, problems with bowel movements, urination, or intercourse. No mood swings.Left knee aches. +tired, no energy,+hot flashes,+stress at work   Physical Exam:BP 110/60 (BP Location: Left Arm, Patient Position: Sitting, Cuff Size: Small)   Pulse 80   Ht 5\' 2"  (1.575 m)   Wt 148 lb (67.1 kg)   BMI 27.07 kg/m  General:  Well developed, well nourished, no acute distress Skin:  Warm and dry Neck:  Midline trachea, normal thyroid, good ROM, no lymphadenopathy Lungs; Clear to auscultation bilaterally Breast:  No dominant palpable mass, retraction, or nipple discharge Cardiovascular: Regular rate and rhythm Abdomen:  Soft, non tender, no hepatosplenomegaly Pelvic:  External genitalia is normal in appearance, no lesions.  The vagina is normal in appearance. Urethra has no lesions or masses. The cervix is bulbous. Pap with HPV performed. Uterus is felt to be normal size, shape, and contour.  No adnexal masses or tenderness noted.Bladder is non tender, no masses felt. Rectal: Good sphincter tone, no polyps, or hemorrhoids felt.  Hemoccult negative. Extremities/musculoskeletal:  No swelling or varicosities noted, no clubbing or cyanosis, good ROM left knee and normal gait Psych:  No mood changes, alert and cooperative,seems happy PHQ 9 score 12. She says she is stressed, denies being suicidal. Will try HRT and take time for self, does paddle board some.   Impression: 1.  Encounter for gynecological examination with Papanicolaou smear of cervix   2. Essential hypertension   3. No energy   4. Hot flashes   5. Hormone replacement therapy (HRT)   6. Screening for colorectal cancer   7. Screening cholesterol level   8. Screening for diabetes mellitus   9. Encounter for vitamin deficiency screening   10. Stress at work       Plan: Check CBC,CMP,TSH and lipids.A1c and vitamin D Physical in 1 year Pap in 3 years if normal Mammogram yearly Call with F/U on HRT Meds ordered this encounter  Medications  . valACYclovir (VALTREX) 1000 MG tablet    Sig: Take 1 tablet (1,000 mg total) by mouth daily.    Dispense:  90 tablet    Refill:  4    Order Specific Question:   Supervising Provider    Answer:   Despina HiddenEURE, Becky Henry [[id number]]  . losartan (COZAAR) 50 MG tablet    Sig: Take 1 tablet (50 mg total) by mouth daily.    Dispense:  90 tablet    Refill:  4    Please consider 90 day supplies to promote better adherence    Order Specific Question:   Supervising Provider    Answer:   Despina HiddenEURE, Becky Henry [[id number]]  . ibuprofen (ADVIL,MOTRIN) 800 MG tablet    Sig: Take 1 tablet (800 mg total) by mouth every 8 (eight) hours as needed.    Dispense:  180 tablet    Refill:  1    Please consider 90 day supplies to promote better adherence    Order Specific Question:   Supervising  Provider    Answer:   Becky Henry [[id number]]  . hydrochlorothiazide (MICROZIDE) 12.5 MG capsule    Sig: Take 1 capsule (12.5 mg total) by mouth daily.    Dispense:  30 capsule    Refill:  0    Order Specific Question:   Supervising Provider    Answer:   Despina Hidden, Becky Henry [[id number]]  . cyclobenzaprine (FLEXERIL) 5 MG tablet    Sig: TAKE ONE TABLET BY MOUTH AT BEDTIME PRN    Dispense:  90 tablet    Refill:  1    Order Specific Question:   Supervising Provider    Answer:   Despina Hidden, Becky Henry [[id number]]  . Conj Estrogens-Bazedoxifene (DUAVEE) 0.45-20 MG TABS    Sig: Take 1 daily    Dispense:  56 tablet    Refill:  0     Order Specific Question:   Supervising Provider    Answer:   Lazaro Arms [[id number]]  See orthopedic doctor about knee

## 2016-12-15 LAB — VITAMIN D 25 HYDROXY (VIT D DEFICIENCY, FRACTURES): Vit D, 25-Hydroxy: 34.9 ng/mL (ref 30.0–100.0)

## 2016-12-15 LAB — COMPREHENSIVE METABOLIC PANEL
ALT: 14 IU/L (ref 0–32)
AST: 15 IU/L (ref 0–40)
Albumin/Globulin Ratio: 1.9 (ref 1.2–2.2)
Albumin: 4.7 g/dL (ref 3.5–5.5)
Alkaline Phosphatase: 56 IU/L (ref 39–117)
BUN / CREAT RATIO: 9 (ref 9–23)
BUN: 7 mg/dL (ref 6–24)
Bilirubin Total: 0.4 mg/dL (ref 0.0–1.2)
CALCIUM: 9.6 mg/dL (ref 8.7–10.2)
CO2: 23 mmol/L (ref 20–29)
CREATININE: 0.74 mg/dL (ref 0.57–1.00)
Chloride: 102 mmol/L (ref 96–106)
GFR calc non Af Amer: 97 mL/min/{1.73_m2} (ref >59)
GFR, EST AFRICAN AMERICAN: 112 mL/min/{1.73_m2} (ref >59)
Globulin, Total: 2.5 g/dL (ref 1.5–4.5)
Glucose: 87 mg/dL (ref 65–99)
Potassium: 4.3 mmol/L (ref 3.5–5.2)
Sodium: 141 mmol/L (ref 134–144)
TOTAL PROTEIN: 7.2 g/dL (ref 6.0–8.5)

## 2016-12-15 LAB — CYTOLOGY - PAP
Diagnosis: NEGATIVE
HPV: NOT DETECTED

## 2016-12-15 LAB — LIPID PANEL
CHOL/HDL RATIO: 3.3 ratio (ref 0.0–4.4)
Cholesterol, Total: 160 mg/dL (ref 100–199)
HDL: 48 mg/dL (ref >39)
LDL Calculated: 59 mg/dL (ref 0–99)
TRIGLYCERIDES: 263 mg/dL — AB (ref 0–149)
VLDL CHOLESTEROL CAL: 53 mg/dL — AB (ref 5–40)

## 2016-12-15 LAB — CBC
HEMATOCRIT: 40.1 % (ref 34.0–46.6)
Hemoglobin: 13.4 g/dL (ref 11.1–15.9)
MCH: 31.7 pg (ref 26.6–33.0)
MCHC: 33.4 g/dL (ref 31.5–35.7)
MCV: 95 fL (ref 79–97)
PLATELETS: 214 10*3/uL (ref 150–379)
RBC: 4.23 x10E6/uL (ref 3.77–5.28)
RDW: 14.4 % (ref 12.3–15.4)
WBC: 5 10*3/uL (ref 3.4–10.8)

## 2016-12-15 LAB — TSH: TSH: 0.847 u[IU]/mL (ref 0.450–4.500)

## 2016-12-15 LAB — HEMOGLOBIN A1C
Est. average glucose Bld gHb Est-mCnc: 97 mg/dL
HEMOGLOBIN A1C: 5 % (ref 4.8–5.6)

## 2017-02-06 ENCOUNTER — Telehealth: Payer: Self-pay | Admitting: Obstetrics & Gynecology

## 2017-02-06 MED ORDER — CONJ ESTROGENS-BAZEDOXIFENE 0.45-20 MG PO TABS: ORAL_TABLET | ORAL | 3 refills | Status: DC

## 2017-02-06 NOTE — Telephone Encounter (Signed)
Refilled duavee  

## 2017-02-14 ENCOUNTER — Other Ambulatory Visit: Payer: Self-pay | Admitting: Adult Health

## 2017-04-03 ENCOUNTER — Other Ambulatory Visit: Payer: Self-pay | Admitting: Adult Health

## 2017-04-13 ENCOUNTER — Other Ambulatory Visit: Payer: Self-pay | Admitting: Women's Health

## 2017-05-18 ENCOUNTER — Other Ambulatory Visit: Payer: Self-pay | Admitting: Adult Health

## 2017-05-25 ENCOUNTER — Telehealth: Payer: Self-pay | Admitting: *Deleted

## 2017-05-25 MED ORDER — ESTRADIOL 1 MG PO TABS: 1.0000 mg | ORAL_TABLET | Freq: Every day | ORAL | 3 refills | Status: DC

## 2017-05-25 MED ORDER — PROGESTERONE MICRONIZED 200 MG PO CAPS: 200.0000 mg | ORAL_CAPSULE | Freq: Every day | ORAL | 3 refills | Status: DC

## 2017-05-25 MED ORDER — LOSARTAN POTASSIUM 50 MG PO TABS: 50.0000 mg | ORAL_TABLET | Freq: Every day | ORAL | 4 refills | Status: DC

## 2017-05-25 NOTE — Telephone Encounter (Signed)
Pt aware [facility] and [facility] will change to estrace and Prometrium

## 2017-07-12 ENCOUNTER — Other Ambulatory Visit: Payer: Self-pay | Admitting: Adult Health

## 2017-08-08 ENCOUNTER — Telehealth: Payer: Self-pay | Admitting: Adult Health

## 2017-08-08 NOTE — Telephone Encounter (Signed)
Pt called stating that the estradiol and progesterone that she was prescribed makes her really sleep. She requests that alternate medication be prescribed. [facility] and she could check with her pharmacy in the next 24 hours. Pt verbalized understanding.

## 2017-08-09 MED ORDER — PROGESTERONE MICRONIZED 100 MG PO CAPS: 100.0000 mg | ORAL_CAPSULE | Freq: Every day | ORAL | 3 refills | Status: DC

## 2017-08-09 NOTE — Telephone Encounter (Signed)
Pt says Prometrium makes her sleepy will decrease to 100 mg

## 2017-09-11 ENCOUNTER — Other Ambulatory Visit: Payer: Self-pay | Admitting: Adult Health

## 2017-09-12 ENCOUNTER — Telehealth: Payer: Self-pay | Admitting: Adult Health

## 2017-09-12 MED ORDER — SERTRALINE HCL 50 MG PO TABS: 50.0000 mg | ORAL_TABLET | Freq: Every day | ORAL | 3 refills | Status: DC

## 2017-09-12 NOTE — Telephone Encounter (Signed)
Pt wants to go back on zoloft,will rx

## 2017-12-31 ENCOUNTER — Ambulatory Visit (INDEPENDENT_AMBULATORY_CARE_PROVIDER_SITE_OTHER): Payer: BLUE CROSS/BLUE SHIELD | Admitting: Adult Health

## 2017-12-31 ENCOUNTER — Encounter: Payer: Self-pay | Admitting: Adult Health

## 2017-12-31 VITALS — BP 131/84 | HR 73 | Ht 62.0 in | Wt 159.0 lb

## 2017-12-31 DIAGNOSIS — Z79890 Hormone replacement therapy (postmenopausal): Secondary | ICD-10-CM | POA: Diagnosis not present

## 2017-12-31 DIAGNOSIS — E782 Mixed hyperlipidemia: Secondary | ICD-10-CM

## 2017-12-31 DIAGNOSIS — Z01419 Encounter for gynecological examination (general) (routine) without abnormal findings: Secondary | ICD-10-CM

## 2017-12-31 DIAGNOSIS — Z1211 Encounter for screening for malignant neoplasm of colon: Secondary | ICD-10-CM

## 2017-12-31 DIAGNOSIS — F329 Major depressive disorder, single episode, unspecified: Secondary | ICD-10-CM

## 2017-12-31 DIAGNOSIS — Z1212 Encounter for screening for malignant neoplasm of rectum: Secondary | ICD-10-CM

## 2017-12-31 DIAGNOSIS — I10 Essential (primary) hypertension: Secondary | ICD-10-CM

## 2017-12-31 DIAGNOSIS — F32A Depression, unspecified: Secondary | ICD-10-CM

## 2017-12-31 LAB — HEMOCCULT GUIAC POC 1CARD (OFFICE): Fecal Occult Blood, POC: NEGATIVE

## 2017-12-31 MED ORDER — SERTRALINE HCL 50 MG PO TABS: 50.0000 mg | ORAL_TABLET | Freq: Every day | ORAL | 3 refills | Status: DC

## 2017-12-31 MED ORDER — HYDROCHLOROTHIAZIDE 12.5 MG PO CAPS: 12.5000 mg | ORAL_CAPSULE | Freq: Every day | ORAL | 3 refills | Status: DC

## 2017-12-31 MED ORDER — ESTRADIOL-LEVONORGESTREL 0.045-0.015 MG/DAY TD PTWK: 1.0000 | MEDICATED_PATCH | TRANSDERMAL | 12 refills | Status: DC

## 2017-12-31 MED ORDER — LOSARTAN POTASSIUM 50 MG PO TABS: 50.0000 mg | ORAL_TABLET | Freq: Every day | ORAL | 4 refills | Status: DC

## 2017-12-31 MED ORDER — VALACYCLOVIR HCL 1 G PO TABS: 1000.0000 mg | ORAL_TABLET | Freq: Every day | ORAL | 4 refills | Status: DC

## 2017-12-31 MED ORDER — IBUPROFEN 800 MG PO TABS: 800.0000 mg | ORAL_TABLET | Freq: Three times a day (TID) | ORAL | 3 refills | Status: DC | PRN

## 2017-12-31 MED ORDER — CYCLOBENZAPRINE HCL 5 MG PO TABS: ORAL_TABLET | ORAL | 1 refills | Status: DC

## 2017-12-31 NOTE — Progress Notes (Signed)
Patient ID: Becky Henry E Henry, female   DOB: Aug 29, 1970, 47 y.o.   MRN: [id number] History of Present Illness: Becky Henry is a 47 year old white female, single in for well woman gyn exam,she had a normal pap with negative HPV 12/14/17.   Current Medications, Allergies, Past Medical History, Past Surgical History, Family History and Social History were reviewed in [name] electronic medical record.     Review of Systems: Patient denies any headaches, hearing loss, fatigue, blurred vision, shortness of breath, chest pain, abdominal pain, problems with bowel movements, urination, or intercourse(not sexually active) No joint pain or mood swings. Has rectal bleeding at times. Has left eye pressure, no mucous production. Left elbow hurts, but has been padding boarding.  +hot flashes, night sweats    Physical Exam:BP 131/84 (BP Location: Left Arm, Patient Position: Sitting, Cuff Size: Normal)   Pulse 73   Ht 5\' 2"  (1.575 m)   Wt 159 lb (72.1 kg)   BMI 29.08 kg/m  General:  Well developed, well nourished, no acute distress Skin:  Warm and dry Neck:  Midline trachea, normal thyroid, good ROM, no lymphadenopathy Lungs; Clear to auscultation bilaterally Breast:  No dominant palpable mass, retraction, or nipple discharge Cardiovascular: Regular rate and rhythm Abdomen:  Soft, non tender, no hepatosplenomegaly Pelvic:  External genitalia is normal in appearance, no lesions.  The vagina is normal in appearance,has clit ring. Urethra has no lesions or masses. The cervix is bulbous.  Uterus is felt to be normal size, shape, and contour.  No adnexal masses or tenderness noted.Bladder is non tender, no masses felt. Rectal: Good sphincter tone, no polyps, or hemorrhoids felt.  Hemoccult negative. Extremities/musculoskeletal:  No swelling or varicosities noted, no clubbing or cyanosis, left elbow not red or swollen Psych:  No mood changes, alert and cooperative,seems happy PHQ 9 score is 17,denies being  suicidal and says she is good with zoloft at current dose  Examination chaperoned by Becky FlakePeggy Henry CMA. Discussed HRT and since she stopped po, will try patch.   Impression: 1. Encounter for well woman exam with routine gynecological exam   2. Screening for colorectal cancer   3. Essential hypertension   4. Depression, unspecified depression type   5. Hormone replacement therapy (HRT)   6. Elevated triglycerides with high cholesterol       Plan: Check CBC,CMP,TSH and lipids.meds Meds ordered this encounter  Medications  . cyclobenzaprine (FLEXERIL) 5 MG tablet    Sig: TAKE 1 TABLET AT BEDTIME AS NEEDED    Dispense:  90 tablet    Refill:  1    Order Specific Question:   Supervising Provider    Answer:   Becky Henry [[id number]]  . hydrochlorothiazide (MICROZIDE) 12.5 MG capsule    Sig: Take 1 capsule (12.5 mg total) by mouth daily.    Dispense:  90 capsule    Refill:  3    Order Specific Question:   Supervising Provider    Answer:   Becky HiddenEURE, LUTHER Henry [[id number]]  . ibuprofen (IBU) 800 MG tablet    Sig: Take 1 tablet (800 mg total) by mouth every 8 (eight) hours as needed.    Dispense:  60 tablet    Refill:  3    Order Specific Question:   Supervising Provider    Answer:   Becky HiddenEURE, LUTHER Henry [[id number]]  . losartan (COZAAR) 50 MG tablet    Sig: Take 1 tablet (50 mg total) by mouth daily.    Dispense:  90 tablet  Refill:  4    Please consider 90 day supplies to promote better adherence    Order Specific Question:   Supervising Provider    Answer:   Becky Henry, LUTHER Henry [[id number]]  . sertraline (ZOLOFT) 50 MG tablet    Sig: Take 1 tablet (50 mg total) by mouth daily.    Dispense:  90 tablet    Refill:  3    Order Specific Question:   Supervising Provider    Answer:   Becky Henry, LUTHER Henry [[id number]]  . valACYclovir (VALTREX) 1000 MG tablet    Sig: Take 1 tablet (1,000 mg total) by mouth daily.    Dispense:  90 tablet    Refill:  4    Order Specific Question:   Supervising Provider    Answer:   Becky Henry, LUTHER  Henry [[id number]]  . estradiol-levonorgestrel (CLIMARAPRO) 0.045-0.015 MG/DAY    Sig: Place 1 patch onto the skin once a week.    Dispense:  4 patch    Refill:  12    Order Specific Question:   Supervising Provider    Answer:   Becky Henry [[id number]]   Physical and pap in 1 year Get mammogram now Referred to Dr Becky Henry for colonoscopy  Try allegra or zyrtec for allergies Rest and ice elbow and if not better call

## 2018-01-02 ENCOUNTER — Encounter: Payer: Self-pay | Admitting: Gastroenterology

## 2018-01-05 LAB — LIPID PANEL
CHOL/HDL RATIO: 3.5 ratio (ref 0.0–4.4)
Cholesterol, Total: 181 mg/dL (ref 100–199)
HDL: 51 mg/dL (ref >39)
LDL CALC: 65 mg/dL (ref 0–99)
TRIGLYCERIDES: 326 mg/dL — AB (ref 0–149)
VLDL Cholesterol Cal: 65 mg/dL — ABNORMAL HIGH (ref 5–40)

## 2018-01-05 LAB — COMPREHENSIVE METABOLIC PANEL
A/G RATIO: 2.1 (ref 1.2–2.2)
ALT: 19 IU/L (ref 0–32)
AST: 18 IU/L (ref 0–40)
Albumin: 4.6 g/dL (ref 3.5–5.5)
Alkaline Phosphatase: 66 IU/L (ref 39–117)
BILIRUBIN TOTAL: 0.2 mg/dL (ref 0.0–1.2)
BUN / CREAT RATIO: 12 (ref 9–23)
BUN: 9 mg/dL (ref 6–24)
CALCIUM: 9.1 mg/dL (ref 8.7–10.2)
CHLORIDE: 104 mmol/L (ref 96–106)
CO2: 23 mmol/L (ref 20–29)
Creatinine, Ser: 0.76 mg/dL (ref 0.57–1.00)
GFR calc Af Amer: 107 mL/min/{1.73_m2} (ref >59)
GFR, EST NON AFRICAN AMERICAN: 93 mL/min/{1.73_m2} (ref >59)
Globulin, Total: 2.2 g/dL (ref 1.5–4.5)
Glucose: 87 mg/dL (ref 65–99)
POTASSIUM: 4.4 mmol/L (ref 3.5–5.2)
Sodium: 141 mmol/L (ref 134–144)
TOTAL PROTEIN: 6.8 g/dL (ref 6.0–8.5)

## 2018-01-05 LAB — CBC
HEMATOCRIT: 37.3 % (ref 34.0–46.6)
Hemoglobin: 12.8 g/dL (ref 11.1–15.9)
MCH: 32.2 pg (ref 26.6–33.0)
MCHC: 34.3 g/dL (ref 31.5–35.7)
MCV: 94 fL (ref 79–97)
PLATELETS: 265 10*3/uL (ref 150–450)
RBC: 3.98 x10E6/uL (ref 3.77–5.28)
RDW: 12.8 % (ref 12.3–15.4)
WBC: 5.9 10*3/uL (ref 3.4–10.8)

## 2018-01-05 LAB — TSH: TSH: 0.578 u[IU]/mL (ref 0.450–4.500)

## 2018-01-07 ENCOUNTER — Encounter: Payer: Self-pay | Admitting: Adult Health

## 2018-01-07 ENCOUNTER — Other Ambulatory Visit: Payer: Self-pay | Admitting: Adult Health

## 2018-01-07 DIAGNOSIS — E782 Mixed hyperlipidemia: Secondary | ICD-10-CM

## 2018-01-07 HISTORY — DX: Mixed hyperlipidemia: E78.2

## 2018-01-07 MED ORDER — FENOFIBRATE 54 MG PO TABS: 54.0000 mg | ORAL_TABLET | Freq: Every day | ORAL | 3 refills | Status: DC

## 2018-01-07 NOTE — Progress Notes (Signed)
rx tricor

## 2018-03-07 ENCOUNTER — Ambulatory Visit: Payer: BLUE CROSS/BLUE SHIELD | Admitting: Gastroenterology

## 2018-03-13 ENCOUNTER — Telehealth: Payer: Self-pay | Admitting: *Deleted

## 2018-03-18 NOTE — Telephone Encounter (Signed)
Mailbox is full , pt no showed for GI appt, so they cancelled referral

## 2018-07-29 ENCOUNTER — Telehealth: Payer: Self-pay | Admitting: Adult Health

## 2018-07-29 NOTE — Telephone Encounter (Signed)
Patient called stating that she would like to schedule and appointment with [facility]. Patient did not state the reason why, please contact pt

## 2018-07-29 NOTE — Telephone Encounter (Signed)
Pt states that her left ankle and bilateral hands are swollen and have been this way for a week. She said that her hands are painful. She is unable to clean self in bathroom, dress self, or brush teeth. In the mornings is worse than the afternoon. Requesting to speak with a nurse.

## 2018-07-29 NOTE — Telephone Encounter (Signed)
No answer, mail box is full, she would need to be seen when having the problem or could go to urgent care

## 2018-07-30 ENCOUNTER — Telehealth: Payer: Self-pay | Admitting: Adult Health

## 2018-07-30 NOTE — Telephone Encounter (Signed)
Patient called stating that she called yesterday and she might have missed the call or never got a phone call. Pt states that she has swelling of her hands and feet. Please contact pt

## 2018-07-31 ENCOUNTER — Telehealth: Payer: Self-pay | Admitting: *Deleted

## 2018-07-31 NOTE — Telephone Encounter (Signed)
mychart message to patient with restrictions.  

## 2018-08-01 ENCOUNTER — Other Ambulatory Visit: Payer: Self-pay

## 2018-08-01 ENCOUNTER — Encounter: Payer: Self-pay | Admitting: Adult Health

## 2018-08-01 ENCOUNTER — Ambulatory Visit (INDEPENDENT_AMBULATORY_CARE_PROVIDER_SITE_OTHER): Payer: BLUE CROSS/BLUE SHIELD | Admitting: Adult Health

## 2018-08-01 VITALS — BP 122/82 | HR 64 | Ht 62.0 in | Wt 160.0 lb

## 2018-08-01 DIAGNOSIS — M25471 Effusion, right ankle: Secondary | ICD-10-CM | POA: Diagnosis not present

## 2018-08-01 DIAGNOSIS — M25472 Effusion, left ankle: Secondary | ICD-10-CM

## 2018-08-01 DIAGNOSIS — M7989 Other specified soft tissue disorders: Secondary | ICD-10-CM

## 2018-08-01 DIAGNOSIS — M79641 Pain in right hand: Secondary | ICD-10-CM

## 2018-08-01 NOTE — Progress Notes (Signed)
Patient ID: Becky Henry, female   DOB: 1970/09/06, 48 y.o.   MRN: [id number] History of Present Illness: Becky Henry is a 48 year old white female in complaining of pain and swelling in right hand for about a week and swelling in ankles and feet tender. She has been seen at [facility] ER and then yesterday at [facility], she had negative x-rays and had aspiration of right wrist, that was negative for crystals. CRP was 3.7 and ESR was 42. She has appt with orthopedic at [facility] 4/22.  She stopped most all her meds for BP.   Current Medications, Allergies, Past Medical History, Past Surgical History, Family History and Social History were reviewed in [employer] record.     Review of Systems: +pain and swelling in right hand for about a week +swelling in both ankles and feet tender +tired Denies any tick bites or injury    Physical Exam:BP 122/82 (BP Location: Right Arm, Patient Position: Sitting, Cuff Size: Normal)   Pulse 64   Ht _0  (1.575 m)   Wt 160 lb (72.6 kg)   BMI 29.26 kg/m  General:  Well developed, well nourished, no acute distress Skin:  Warm and dry Lungs; Clear to auscultation bilaterally Cardiovascular: Regular rate and rhythm Extremities/musculoskeletal:  + mild swelling in both ankles and right hand is swollen, slightly red and tender to touch Psych:  No mood changes, alert and cooperative,seems very comfortable Note to be out of work till after 4/22 visit, and will refer to Rheumatologist and check ANA and RF today.  Continue pain meds, Ok to take Microzide.  Review x-rays and labs from [facility] and [facility]. Could be RA or lupus, but will await rheumatology evaluation, because I am not sure, at this time, and she says nobody knows what is going on.  Face time 25 minutes, counseling and coordinating care.   Impression: 1. Pain in right hand  - Antinuclear Antib (ANA) - Rheumatoid Factor - Ambulatory referral to Rheumatology  2. Swelling of right hand  -  Antinuclear Antib (ANA) - Rheumatoid Factor - Ambulatory referral to Rheumatology  3. Swelling of both ankles  - Antinuclear Antib (ANA) - Rheumatoid Factor - Ambulatory referral to Rheumatology    Plan: Check ANA and RF Refer to Rheumatology F/U in 4 weeks

## 2018-08-02 LAB — RHEUMATOID FACTOR: Rhuematoid fact SerPl-aCnc: <10 IU/mL (ref 0.0–13.9)

## 2018-08-02 LAB — ANA: Anti Nuclear Antibody (ANA): NEGATIVE

## 2018-08-05 ENCOUNTER — Telehealth: Payer: Self-pay | Admitting: Adult Health

## 2018-08-05 NOTE — Telephone Encounter (Signed)
Pt was waiting on referral to Rheumatogogist   And has not heard from them or [place] please call pt

## 2018-08-05 NOTE — Telephone Encounter (Signed)
Pt advised JAG out of the office this afternoon but I will route note to JAG. Pt voiced understanding. JSY

## 2018-08-06 NOTE — Telephone Encounter (Signed)
Will refer to [facility] rheumatology

## 2018-08-07 ENCOUNTER — Telehealth: Payer: Self-pay | Admitting: Adult Health

## 2018-08-07 NOTE — Telephone Encounter (Signed)
Pt states that Dr. Greggory Stallion that she [place] in [place] stated he would send a email to [place] about recommending medication for her gout. Pt checking status.

## 2018-08-09 NOTE — Telephone Encounter (Signed)
Pt saw Dr Greggory Stallion, he thinks it is Gout,in her hand.  Stop Microzide, BP has been good.   Call Dr Greggory Stallion about gout meds

## 2018-08-13 ENCOUNTER — Encounter: Payer: Self-pay | Admitting: Adult Health

## 2018-08-28 DIAGNOSIS — Z029 Encounter for administrative examinations, unspecified: Secondary | ICD-10-CM

## 2018-08-29 ENCOUNTER — Ambulatory Visit: Payer: BLUE CROSS/BLUE SHIELD | Admitting: Adult Health

## 2019-01-10 ENCOUNTER — Other Ambulatory Visit: Payer: Self-pay | Admitting: Adult Health

## 2019-06-05 ENCOUNTER — Other Ambulatory Visit: Payer: Self-pay | Admitting: Adult Health

## 2019-06-05 ENCOUNTER — Other Ambulatory Visit: Payer: BLUE CROSS/BLUE SHIELD | Admitting: Adult Health

## 2019-07-23 ENCOUNTER — Ambulatory Visit (INDEPENDENT_AMBULATORY_CARE_PROVIDER_SITE_OTHER): Payer: BC Managed Care – PPO | Admitting: Adult Health

## 2019-07-23 ENCOUNTER — Other Ambulatory Visit (HOSPITAL_COMMUNITY): Payer: Self-pay | Admitting: Adult Health

## 2019-07-23 ENCOUNTER — Other Ambulatory Visit (HOSPITAL_COMMUNITY)
Admission: RE | Admit: 2019-07-23 | Discharge: 2019-07-23 | Disposition: A | Discharge status: Home / Self Care | Payer: BC Managed Care – PPO | Source: Ambulatory Visit | Attending: Adult Health | Admitting: Adult Health

## 2019-07-23 ENCOUNTER — Other Ambulatory Visit (HOSPITAL_BASED_OUTPATIENT_CLINIC_OR_DEPARTMENT_OTHER): Payer: Self-pay | Admitting: Adult Health

## 2019-07-23 ENCOUNTER — Encounter: Payer: Self-pay | Admitting: Adult Health

## 2019-07-23 ENCOUNTER — Other Ambulatory Visit: Payer: Self-pay

## 2019-07-23 VITALS — BP 129/81 | HR 70 | Ht 62.0 in | Wt 154.0 lb

## 2019-07-23 DIAGNOSIS — Z01419 Encounter for gynecological examination (general) (routine) without abnormal findings: Secondary | ICD-10-CM | POA: Diagnosis present

## 2019-07-23 DIAGNOSIS — F329 Major depressive disorder, single episode, unspecified: Secondary | ICD-10-CM | POA: Diagnosis not present

## 2019-07-23 DIAGNOSIS — Z1212 Encounter for screening for malignant neoplasm of rectum: Secondary | ICD-10-CM

## 2019-07-23 DIAGNOSIS — F32A Depression, unspecified: Secondary | ICD-10-CM

## 2019-07-23 DIAGNOSIS — Z1211 Encounter for screening for malignant neoplasm of colon: Secondary | ICD-10-CM | POA: Diagnosis not present

## 2019-07-23 DIAGNOSIS — N816 Rectocele: Secondary | ICD-10-CM | POA: Diagnosis not present

## 2019-07-23 DIAGNOSIS — E782 Mixed hyperlipidemia: Secondary | ICD-10-CM

## 2019-07-23 DIAGNOSIS — I10 Essential (primary) hypertension: Secondary | ICD-10-CM | POA: Diagnosis not present

## 2019-07-23 DIAGNOSIS — Z1231 Encounter for screening mammogram for malignant neoplasm of breast: Secondary | ICD-10-CM

## 2019-07-23 LAB — HEMOCCULT GUIAC POC 1CARD (OFFICE): Fecal Occult Blood, POC: NEGATIVE

## 2019-07-23 MED ORDER — LOSARTAN POTASSIUM 50 MG PO TABS: 50.0000 mg | ORAL_TABLET | Freq: Every day | ORAL | 3 refills | Status: DC

## 2019-07-23 MED ORDER — VALACYCLOVIR HCL 1 G PO TABS: 1000.0000 mg | ORAL_TABLET | Freq: Every day | ORAL | 3 refills | Status: DC

## 2019-07-23 MED ORDER — SERTRALINE HCL 50 MG PO TABS: 50.0000 mg | ORAL_TABLET | Freq: Every day | ORAL | 3 refills | Status: DC

## 2019-07-23 MED ORDER — HYDROCHLOROTHIAZIDE 12.5 MG PO CAPS: 12.5000 mg | ORAL_CAPSULE | Freq: Every day | ORAL | 3 refills | Status: DC

## 2019-07-23 MED ORDER — FLUTICASONE PROPIONATE 50 MCG/ACT NA SUSP: 1.0000 | Freq: Two times a day (BID) | NASAL | 3 refills | Status: DC

## 2019-07-23 NOTE — Progress Notes (Signed)
Patient ID: Becky Henry, female   DOB: [birth year], 49 y.o.   MRN: [id number] History of Present Illness: Becky Henry is a 49 year old white female,divorced, [id number] in for well woman gyn exam and pap.    Current Medications, Allergies, Past Medical History, Past Surgical History, Family History and Social History were reviewed in [employer] record.     Review of Systems: Patient denies any headaches, hearing loss, fatigue, blurred vision, shortness of breath, chest pain, abdominal pain, problems with bowel movements(sometimes feels like stool not coming out), urination, or intercourse(not active). No mood swings. Just saw Rheumatologist and is better.   Physical Exam:BP 129/81 (BP Location: Left Arm, Patient Position: Sitting, Cuff Size: Normal)   Pulse 70   Ht 5\' 2"  (1.575 m)   Wt 154 lb (69.9 kg)   BMI 28.17 kg/m  General:  Well developed, well nourished, no acute distress Skin:  Warm and dry Neck:  Midline trachea, normal thyroid, good ROM, no lymphadenopathy Lungs; Clear to auscultation bilaterally Breast:  No dominant palpable mass, retraction, or nipple discharge Cardiovascular: Regular rate and rhythm Abdomen:  Soft, non tender, no hepatosplenomegaly Pelvic:  External genitalia is normal in appearance, no lesions.Has clit ring.  The vagina is normal in appearance. Urethra has no lesions or masses. The cervix is bulbous.Pap with high risk HPV 16/18 genotyping performed.  Uterus is felt to be normal size, shape, and contour.  No adnexal masses or tenderness noted.Bladder is non tender, no masses felt. Rectal: Good sphincter tone, no polyps, or hemorrhoids felt.  Hemoccult negative.+rectocele Extremities/musculoskeletal:  No swelling, has + Varicosities left inner ankle noted, no clubbing or cyanosis Psych:  No mood changes, alert and cooperative,seems happy Alcohol audit is 0 Fall risk is low PHQ 9 score is 9, no SI, is on Zoloft Examination chaperoned by  Becky Henry Rash LPN  Impression and Plan: 1. Encounter for well woman exam with routine gynecological exam  2. Encounter for gynecological examination with Papanicolaou smear of cervix Pap sent Physical in 1 year Pap in 3 if needed Mammogram scheduled for her 4/12 at 6:30 pm at [place] CBC,CMP,TSH   3. Screening for colorectal cancer Referred to Dr Gala Romney for screening colonoscopy   4. Rectocele  5. Essential hypertension Continue meds  Meds ordered this encounter  Medications  . fluticasone (FLONASE) 50 MCG/ACT nasal spray    Sig: Place 1 spray into both nostrils 2 (two) times daily.    Dispense:  18.2 mL    Refill:  3    Order Specific Question:   Supervising Provider    Answer:   Becky Henry [[id number]]  . hydrochlorothiazide (MICROZIDE) 12.5 MG capsule    Sig: Take 1 capsule (12.5 mg total) by mouth daily.    Dispense:  90 capsule    Refill:  3    Order Specific Question:   Supervising Provider    Answer:   Becky Henry [[id number]]  . losartan (COZAAR) 50 MG tablet    Sig: Take 1 tablet (50 mg total) by mouth daily.    Dispense:  90 tablet    Refill:  3    Order Specific Question:   Supervising Provider    Answer:   Becky Henry [[id number]]  . sertraline (ZOLOFT) 50 MG tablet    Sig: Take 1 tablet (50 mg total) by mouth daily.    Dispense:  90 tablet    Refill:  3    Order Specific Question:  Supervising Provider    Answer:   Becky Henry [[id number]]  . valACYclovir (VALTREX) 1000 MG tablet    Sig: Take 1 tablet (1,000 mg total) by mouth daily.    Dispense:  90 tablet    Refill:  3    Order Specific Question:   Supervising Provider    Answer:   Becky Henry [[id number]]    6. Depression, unspecified depression type Continue zoloft  7. Elevated triglycerides with high cholesterol Check lipid profile

## 2019-07-24 LAB — COMPREHENSIVE METABOLIC PANEL
ALT: 13 IU/L (ref 0–32)
AST: 16 IU/L (ref 0–40)
Albumin/Globulin Ratio: 1.7 (ref 1.2–2.2)
Albumin: 4.3 g/dL (ref 3.8–4.8)
Alkaline Phosphatase: 62 IU/L (ref 39–117)
BUN/Creatinine Ratio: 16 (ref 9–23)
BUN: 14 mg/dL (ref 6–24)
Bilirubin Total: 0.2 mg/dL (ref 0.0–1.2)
CO2: 23 mmol/L (ref 20–29)
Calcium: 9 mg/dL (ref 8.7–10.2)
Chloride: 105 mmol/L (ref 96–106)
Creatinine, Ser: 0.89 mg/dL (ref 0.57–1.00)
GFR calc Af Amer: 88 mL/min/{1.73_m2} (ref >59)
GFR calc non Af Amer: 76 mL/min/{1.73_m2} (ref >59)
Globulin, Total: 2.5 g/dL (ref 1.5–4.5)
Glucose: 89 mg/dL (ref 65–99)
Potassium: 4.3 mmol/L (ref 3.5–5.2)
Sodium: 140 mmol/L (ref 134–144)
Total Protein: 6.8 g/dL (ref 6.0–8.5)

## 2019-07-24 LAB — LIPID PANEL
Chol/HDL Ratio: 3.4 ratio (ref 0.0–4.4)
Cholesterol, Total: 207 mg/dL — ABNORMAL HIGH (ref 100–199)
HDL: 61 mg/dL (ref >39)
LDL Chol Calc (NIH): 88 mg/dL (ref 0–99)
Triglycerides: 358 mg/dL — ABNORMAL HIGH (ref 0–149)
VLDL Cholesterol Cal: 58 mg/dL — ABNORMAL HIGH (ref 5–40)

## 2019-07-24 LAB — TSH: TSH: 0.426 u[IU]/mL — ABNORMAL LOW (ref 0.450–4.500)

## 2019-07-24 LAB — CBC
Hematocrit: 37.9 % (ref 34.0–46.6)
Hemoglobin: 12.9 g/dL (ref 11.1–15.9)
MCH: 32.1 pg (ref 26.6–33.0)
MCHC: 34 g/dL (ref 31.5–35.7)
MCV: 94 fL (ref 79–97)
Platelets: 256 10*3/uL (ref 150–450)
RBC: 4.02 x10E6/uL (ref 3.77–5.28)
RDW: 13.1 % (ref 11.7–15.4)
WBC: 6.6 10*3/uL (ref 3.4–10.8)

## 2019-07-25 LAB — CYTOLOGY - PAP
Comment: NEGATIVE
Diagnosis: NEGATIVE
High risk HPV: NEGATIVE

## 2019-07-28 ENCOUNTER — Ambulatory Visit (HOSPITAL_COMMUNITY)
Admission: RE | Admit: 2019-07-28 | Discharge: 2019-07-28 | Disposition: A | Discharge status: Home / Self Care | Payer: BC Managed Care – PPO | Source: Ambulatory Visit | Attending: Adult Health | Admitting: Adult Health

## 2019-07-28 ENCOUNTER — Other Ambulatory Visit: Payer: Self-pay

## 2019-07-28 DIAGNOSIS — Z1231 Encounter for screening mammogram for malignant neoplasm of breast: Secondary | ICD-10-CM | POA: Diagnosis present

## 2019-07-29 ENCOUNTER — Other Ambulatory Visit (HOSPITAL_COMMUNITY): Payer: Self-pay | Admitting: Adult Health

## 2019-07-29 DIAGNOSIS — R928 Other abnormal and inconclusive findings on diagnostic imaging of breast: Secondary | ICD-10-CM

## 2019-07-30 ENCOUNTER — Encounter: Payer: Self-pay | Admitting: Gastroenterology

## 2019-08-05 ENCOUNTER — Ambulatory Visit (HOSPITAL_COMMUNITY)
Admission: RE | Admit: 2019-08-05 | Discharge: 2019-08-05 | Disposition: A | Discharge status: Home / Self Care | Payer: BC Managed Care – PPO | Source: Ambulatory Visit | Attending: Adult Health | Admitting: Adult Health

## 2019-08-05 ENCOUNTER — Other Ambulatory Visit: Payer: Self-pay

## 2019-08-05 DIAGNOSIS — R928 Other abnormal and inconclusive findings on diagnostic imaging of breast: Secondary | ICD-10-CM | POA: Insufficient documentation

## 2019-10-30 ENCOUNTER — Encounter: Payer: Self-pay | Admitting: Internal Medicine

## 2019-10-30 ENCOUNTER — Other Ambulatory Visit: Payer: Self-pay

## 2019-10-30 ENCOUNTER — Ambulatory Visit: Payer: BC Managed Care – PPO

## 2019-12-17 ENCOUNTER — Telehealth: Payer: Self-pay | Admitting: Adult Health

## 2019-12-17 NOTE — Telephone Encounter (Signed)
Patient called in wanting to know can she get a refill on her thyroid medicine.

## 2019-12-17 NOTE — Telephone Encounter (Signed)
Mailbox full @ 2:30 pm. JSY

## 2019-12-18 NOTE — Telephone Encounter (Addendum)
Pt had thyroid checked in April. Was on med for 1 month and then stopped med. Pt wants to start back on thyroid med. Please advise. Thanks!!! [employer]

## 2019-12-19 NOTE — Telephone Encounter (Signed)
Mail box full @ 1:51 pm. JSY 

## 2019-12-19 NOTE — Telephone Encounter (Signed)
Mailbox full @ 8:47 am. [employer]

## 2019-12-23 NOTE — Telephone Encounter (Signed)
Mailbox full @ 9:17 am. 3 attempts to reach pt. Encounter closed. JSY

## 2020-01-28 DIAGNOSIS — M7752 Other enthesopathy of left foot: Secondary | ICD-10-CM | POA: Insufficient documentation

## 2020-01-28 DIAGNOSIS — M0600 Rheumatoid arthritis without rheumatoid factor, unspecified site: Secondary | ICD-10-CM | POA: Insufficient documentation

## 2020-02-23 ENCOUNTER — Other Ambulatory Visit: Payer: Self-pay

## 2020-02-23 ENCOUNTER — Ambulatory Visit (INDEPENDENT_AMBULATORY_CARE_PROVIDER_SITE_OTHER): Payer: BC Managed Care – PPO | Admitting: *Deleted

## 2020-02-23 ENCOUNTER — Encounter: Payer: Self-pay | Admitting: *Deleted

## 2020-02-23 VITALS — Ht 62.0 in | Wt 150.0 lb

## 2020-02-23 DIAGNOSIS — Z1211 Encounter for screening for malignant neoplasm of colon: Secondary | ICD-10-CM

## 2020-02-23 MED ORDER — PEG 3350-KCL-NA BICARB-NACL 420 G PO SOLR: 4000.0000 mL | Freq: Once | ORAL | 0 refills | Status: AC

## 2020-02-23 NOTE — Patient Instructions (Addendum)
Becky Henry   [birth year] MRN: [id number]    Procedure Date: 04/02/2020 Time to register: 12:45 pm Place to register: [facility] Procedure Time: 2:15 pm Scheduled provider: Dr. Abbey Chatters  PREPARATION FOR COLONOSCOPY WITH TRI-LYTE SPLIT PREP  Please notify us immediately if you are diabetic, take iron supplements, or if you are on Coumadin or any other blood thinners.   Please hold the following medications: n/a  You will need to purchase 1 fleet enema and 1 box of Bisacodyl $RemoveBefo'5mg'[id number]$  tablets.   2 DAYS BEFORE PROCEDURE:  DATE: 03/31/2020  DAY: Wednesday Begin clear liquid diet AFTER your lunch meal. NO SOLID FOODS after this point.  1 DAY BEFORE PROCEDURE:  DATE: 04/01/2020   DAY: Thursday Continue clear liquids the entire day - NO SOLID FOOD.   Diabetic medications adjustments for today: n/a  At 2:00 pm:  Take 2 Bisacodyl tablets.   At 4:00pm:  Start drinking your solution. Make sure you mix well per instructions on the bottle. Try to drink 1 (one) 8 ounce glass every 10-15 minutes until you have consumed HALF the jug. You should complete by 6:00pm.You must keep the left over solution refrigerated until completed next day.  Continue clear liquids. You must drink plenty of clear liquids to prevent dehyration and kidney failure.     DAY OF PROCEDURE:   DATE: 04/02/2020   DAY: Friday If you take medications for your heart, blood pressure or breathing, you may take these medications.  Diabetic medications adjustments for today: n/a  Five hours before your procedure time @ 9:15 am:  Finish remaining amout of bowel prep, drinking 1 (one) 8 ounce glass every 10-15 minutes until complete. You have two hours to consume remaining prep.   Three hours before your procedure time @ 11:15 am:  Nothing by mouth.   At least one hour before going to the hospital:  Give yourself one Fleet enema. You may take your morning medications with sip of water unless we have instructed otherwise.       Please see below for Dietary Information.  CLEAR LIQUIDS INCLUDE:  Water Jello (NOT red in color)   Ice Popsicles (NOT red in color)   Tea (sugar ok, no milk/cream) Powdered fruit flavored drinks  Coffee (sugar ok, no milk/cream) Gatorade/ Lemonade/ Kool-Aid  (NOT red in color)   Juice: apple, white grape, white cranberry Soft drinks  Clear bullion, consomme, broth (fat free beef/chicken/vegetable)  Carbonated beverages (any kind)  Strained chicken noodle soup Hard Candy   Remember: Clear liquids are liquids that will allow you to see your fingers on the other side of a clear glass. Be sure liquids are NOT red in color, and not cloudy, but CLEAR.  DO NOT EAT OR DRINK ANY OF THE FOLLOWING:  Dairy products of any kind   Cranberry juice Tomato juice / V8 juice   Grapefruit juice Orange juice     Red grape juice  Do not eat any solid foods, including such foods as: cereal, oatmeal, yogurt, fruits, vegetables, creamed soups, eggs, bread, crackers, pureed foods in a blender, etc.   HELPFUL HINTS FOR DRINKING PREP SOLUTION:   Make sure prep is extremely cold. Mix and refrigerate the the morning of the prep. You may also put in the freezer.   You may try mixing some Crystal Light or Country Time Lemonade if you prefer. Mix in small amounts; add more if necessary.  Try drinking through a straw  Rinse mouth with water or  a mouthwash between glasses, to remove after-taste.  Try sipping on a cold beverage /ice/ popsicles between glasses of prep.  Place a piece of sugar-free hard candy in mouth between glasses.  If you become nauseated, try consuming smaller amounts, or stretch out the time between glasses. Stop for 30-60 minutes, then slowly start back drinking.        OTHER INSTRUCTIONS  You will need a responsible adult at least 50 years of age to accompany you and drive you home. This person must remain in the waiting room during your procedure. The hospital will cancel  your procedure if you do not have a responsible adult with you.   1. Wear loose fitting clothing that is easily removed. 2. Leave jewelry and other valuables at home.  3. Remove all body piercing jewelry and leave at home. 4. Total time from sign-in until discharge is approximately 2-3 hours. 5. You should go home directly after your procedure and rest. You can resume normal activities the day after your procedure. 6. The day of your procedure you should not:  Drive  Make legal decisions  Operate machinery  Drink alcohol  Return to work   You may call the office (Dept: [phone]) before 5:00pm, or page the doctor on call [phone]) after 5:00pm, for further instructions, if necessary.   Insurance Information YOU WILL NEED TO CHECK WITH YOUR INSURANCE COMPANY FOR THE BENEFITS OF COVERAGE YOU HAVE FOR THIS PROCEDURE.  UNFORTUNATELY, NOT ALL INSURANCE COMPANIES HAVE BENEFITS TO COVER ALL OR PART OF THESE TYPES OF PROCEDURES.  IT IS YOUR RESPONSIBILITY TO CHECK YOUR BENEFITS, HOWEVER, WE WILL BE GLAD TO ASSIST YOU WITH ANY CODES YOUR INSURANCE COMPANY MAY NEED.    PLEASE NOTE THAT MOST INSURANCE COMPANIES WILL NOT COVER A SCREENING COLONOSCOPY FOR PEOPLE UNDER THE AGE OF 50  IF YOU HAVE BCBS INSURANCE, YOU MAY HAVE BENEFITS FOR A SCREENING COLONOSCOPY BUT IF POLYPS ARE FOUND THE DIAGNOSIS WILL CHANGE AND THEN YOU MAY HAVE A DEDUCTIBLE THAT WILL NEED TO BE MET. SO PLEASE MAKE SURE YOU CHECK YOUR BENEFITS FOR A SCREENING COLONOSCOPY AS WELL AS A DIAGNOSTIC COLONOSCOPY.

## 2020-02-23 NOTE — Progress Notes (Addendum)
Gastroenterology Pre-Procedure Review  Request Date: 02/23/2020 Requesting Physician: Cyril Mourning, NP, no previous TCS  PATIENT REVIEW QUESTIONS: The patient responded to the following health history questions as indicated:    1. Diabetes Melitis: no 2. Joint replacements in the past 12 months: no 3. Major health problems in the past 3 months: no 4. Has an artificial valve or MVP: no 5. Has a defibrillator: no 6. Has been advised in past to take antibiotics in advance of a procedure like teeth cleaning: no 7. Family history of colon cancer: no  8. Alcohol Use: no 9. Illicit drug Use: no 10. History of sleep apnea: no  11. History of coronary artery or other vascular stents placed within the last 12 months: no 12. History of any prior anesthesia complications: yes, stopped breathing (ablation) 13. Body mass index is 27.44 kg/m.    MEDICATIONS & ALLERGIES:    Patient reports the following regarding taking any blood thinners:   Plavix? no Aspirin? no Coumadin? no Brilinta? no Xarelto? no Eliquis? no Pradaxa? no Savaysa? no Effient?   Patient confirms/reports the following medications:  Current Outpatient Medications  Medication Sig Dispense Refill  . HUMIRA PEN 40 MG/0.4ML PNKT once a week.     . losartan (COZAAR) 50 MG tablet Take 1 tablet (50 mg total) by mouth daily. 90 tablet 3  . meloxicam (MOBIC) 15 MG tablet Take 15 mg by mouth daily.    . traMADol (ULTRAM) 50 MG tablet Take 50 mg by mouth 2 (two) times daily as needed.     No current facility-administered medications for this visit.    Patient confirms/reports the following allergies:  Allergies  Allergen Reactions  . Sulfa Antibiotics Hives and Swelling    Also affected liver.    No orders of the defined types were placed in this encounter.   AUTHORIZATION INFORMATION Primary Insurance: [place],  [place] #: [id number],  Group #: [id number] Pre-Cert / Auth required: No, submit to local BCBS  SCHEDULE  INFORMATION: Procedure has been scheduled as follows:  Date: 03/22/2020, Time: 9:00 Location: [facility] with Dr. Marletta Lor  This Gastroenterology Pre-Precedure Review Form is being routed to the following provider(s): Wynne Dust, NP

## 2020-03-02 NOTE — Progress Notes (Signed)
Ok to schedule.

## 2020-03-02 NOTE — Progress Notes (Signed)
Sorry, ASA II

## 2020-03-17 ENCOUNTER — Telehealth: Payer: Self-pay | Admitting: *Deleted

## 2020-03-17 NOTE — Telephone Encounter (Signed)
Tried to call pt in regards to rescheduling her procedure.  VM full and could not accept any messages.

## 2020-03-18 ENCOUNTER — Telehealth: Payer: Self-pay | Admitting: Internal Medicine

## 2020-03-18 ENCOUNTER — Encounter: Payer: Self-pay | Admitting: *Deleted

## 2020-03-18 NOTE — Telephone Encounter (Signed)
Spoke to pt and informed her that her procedure on 03/22/20 needed to be rescheduled due to a death in Dr. [place] Blossom family.  Pt rescheduled Covid screening to 03/31/2020 at 3:25.  She rescheduled procedure to 04/02/2020 at 2:15 with arrival at 12:45.  Pt made aware that I will mail out new instructions.  Left voice mail for [place] in Endo.

## 2020-03-18 NOTE — Telephone Encounter (Signed)
Left detailed message on pt's voice mail that her Covid screening and procedure needs to be rescheduled.

## 2020-03-18 NOTE — Telephone Encounter (Signed)
Tried to call pt again but unsuccessful.  Left multiple message this morning.

## 2020-03-18 NOTE — Telephone Encounter (Signed)
Please call patient at [phone]. She is scheduled procedure with Dr Marletta Lor on 03/22/2020, but he is out for bereavement. She is asking about having her covid testing done somewhere closer to her home/work. Please advise.

## 2020-03-19 ENCOUNTER — Other Ambulatory Visit (HOSPITAL_COMMUNITY)
Admission: RE | Admit: 2020-03-19 | Discharge: 2020-03-19 | Disposition: A | Discharge status: Home / Self Care | Payer: BC Managed Care – PPO | Source: Ambulatory Visit | Attending: Internal Medicine | Admitting: Internal Medicine

## 2020-03-25 ENCOUNTER — Other Ambulatory Visit: Payer: Self-pay | Admitting: Adult Health

## 2020-03-31 ENCOUNTER — Other Ambulatory Visit: Payer: Self-pay

## 2020-03-31 ENCOUNTER — Other Ambulatory Visit (HOSPITAL_COMMUNITY)
Admission: RE | Admit: 2020-03-31 | Discharge: 2020-03-31 | Disposition: A | Discharge status: Home / Self Care | Payer: BC Managed Care – PPO | Source: Ambulatory Visit | Attending: Internal Medicine | Admitting: Internal Medicine

## 2020-03-31 DIAGNOSIS — Z01812 Encounter for preprocedural laboratory examination: Secondary | ICD-10-CM | POA: Insufficient documentation

## 2020-03-31 DIAGNOSIS — Z791 Long term (current) use of non-steroidal anti-inflammatories (NSAID): Secondary | ICD-10-CM | POA: Diagnosis not present

## 2020-03-31 DIAGNOSIS — K648 Other hemorrhoids: Secondary | ICD-10-CM | POA: Diagnosis not present

## 2020-03-31 DIAGNOSIS — Z20822 Contact with and (suspected) exposure to covid-19: Secondary | ICD-10-CM | POA: Insufficient documentation

## 2020-03-31 DIAGNOSIS — Z79899 Other long term (current) drug therapy: Secondary | ICD-10-CM | POA: Diagnosis not present

## 2020-03-31 DIAGNOSIS — Z1211 Encounter for screening for malignant neoplasm of colon: Secondary | ICD-10-CM | POA: Diagnosis present

## 2020-04-01 LAB — SARS CORONAVIRUS 2 (TAT 6-24 HRS): SARS Coronavirus 2: NEGATIVE

## 2020-04-02 ENCOUNTER — Ambulatory Visit (HOSPITAL_COMMUNITY)
Admission: RE | Admit: 2020-04-02 | Discharge: 2020-04-02 | Disposition: A | Discharge status: Home / Self Care | Payer: BC Managed Care – PPO | Attending: Internal Medicine | Admitting: Internal Medicine

## 2020-04-02 ENCOUNTER — Encounter (HOSPITAL_COMMUNITY)
Admission: RE | Disposition: A | Discharge status: Home / Self Care | Payer: Self-pay | Source: Home / Self Care | Attending: Internal Medicine

## 2020-04-02 ENCOUNTER — Other Ambulatory Visit: Payer: Self-pay

## 2020-04-02 ENCOUNTER — Ambulatory Visit (HOSPITAL_COMMUNITY): Payer: BC Managed Care – PPO | Admitting: Anesthesiology

## 2020-04-02 ENCOUNTER — Encounter (HOSPITAL_COMMUNITY): Payer: Self-pay

## 2020-04-02 DIAGNOSIS — Z791 Long term (current) use of non-steroidal anti-inflammatories (NSAID): Secondary | ICD-10-CM | POA: Insufficient documentation

## 2020-04-02 DIAGNOSIS — Z1211 Encounter for screening for malignant neoplasm of colon: Secondary | ICD-10-CM | POA: Diagnosis not present

## 2020-04-02 DIAGNOSIS — Z20822 Contact with and (suspected) exposure to covid-19: Secondary | ICD-10-CM | POA: Insufficient documentation

## 2020-04-02 DIAGNOSIS — K648 Other hemorrhoids: Secondary | ICD-10-CM | POA: Insufficient documentation

## 2020-04-02 DIAGNOSIS — Z79899 Other long term (current) drug therapy: Secondary | ICD-10-CM | POA: Insufficient documentation

## 2020-04-02 HISTORY — PX: COLONOSCOPY WITH PROPOFOL: SHX5780

## 2020-04-02 SURGERY — COLONOSCOPY WITH PROPOFOL
Anesthesia: General

## 2020-04-02 MED ORDER — LACTATED RINGERS IV SOLN
INTRAVENOUS | Status: DC
  Administered 2020-04-02: 1000 mL via INTRAVENOUS

## 2020-04-02 MED ORDER — PROPOFOL 10 MG/ML IV BOLUS
INTRAVENOUS | Status: DC | PRN
  Administered 2020-04-02 (×3): 50 mg via INTRAVENOUS
  Administered 2020-04-02: 130 mg via INTRAVENOUS
  Administered 2020-04-02: 20 mg via INTRAVENOUS

## 2020-04-02 MED ORDER — LIDOCAINE HCL (CARDIAC) PF 100 MG/5ML IV SOSY
PREFILLED_SYRINGE | INTRAVENOUS | Status: DC | PRN
  Administered 2020-04-02: 50 mg via INTRAVENOUS

## 2020-04-02 MED ORDER — STERILE WATER FOR IRRIGATION IR SOLN
Status: DC | PRN
  Administered 2020-04-02: 100 mL

## 2020-04-02 MED ORDER — CHLORHEXIDINE GLUCONATE CLOTH 2 % EX PADS: 6.0000 | MEDICATED_PAD | Freq: Once | CUTANEOUS | Status: DC

## 2020-04-02 NOTE — Discharge Instructions (Addendum)
  Colonoscopy Discharge Instructions  Read the instructions outlined below and refer to this sheet in the next few weeks. These discharge instructions provide you with general information on caring for yourself after you leave the hospital. Your doctor may also give you specific instructions. While your treatment has been planned according to the most current medical practices available, unavoidable complications occasionally occur.   ACTIVITY  You may resume your regular activity, but move at a slower pace for the next 24 hours.   Take frequent rest periods for the next 24 hours.   Walking will help get rid of the air and reduce the bloated feeling in your belly (abdomen).   No driving for 24 hours (because of the medicine (anesthesia) used during the test).    Do not sign any important legal documents or operate any machinery for 24 hours (because of the anesthesia used during the test).  NUTRITION  Drink plenty of fluids.   You may resume your normal diet as instructed by your doctor.   Begin with a light meal and progress to your normal diet. Heavy or fried foods are harder to digest and may make you feel sick to your stomach (nauseated).   Avoid alcoholic beverages for 24 hours or as instructed.  MEDICATIONS  You may resume your normal medications unless your doctor tells you otherwise.  WHAT YOU CAN EXPECT TODAY  Some feelings of bloating in the abdomen.   Passage of more gas than usual.   Spotting of blood in your stool or on the toilet paper.  IF YOU HAD POLYPS REMOVED DURING THE COLONOSCOPY:  No aspirin products for 7 days or as instructed.   No alcohol for 7 days or as instructed.   Eat a soft diet for the next 24 hours.  FINDING OUT THE RESULTS OF YOUR TEST Not all test results are available during your visit. If your test results are not back during the visit, make an appointment with your caregiver to find out the results. Do not assume everything is normal if  you have not heard from your caregiver or the medical facility. It is important for you to follow up on all of your test results.  SEEK IMMEDIATE MEDICAL ATTENTION IF:  You have more than a spotting of blood in your stool.   Your belly is swollen (abdominal distention).   You are nauseated or vomiting.   You have a temperature over 101.   You have abdominal pain or discomfort that is severe or gets worse throughout the day.   Your colonoscopy was relatively unremarkable.  I did not find any polyps or evidence of colon cancer.  I would recommend we repeat this in 10 years for screening purposes.  Follow-up with GI as needed.  I hope you have a great rest of your week!  Hennie Duos. Marletta Lor, D.O. Gastroenterology and Hepatology [facility]

## 2020-04-02 NOTE — Anesthesia Postprocedure Evaluation (Signed)
Anesthesia Post Note  Patient: Becky Henry  Procedure(s) Performed: COLONOSCOPY WITH PROPOFOL (N/A )  Patient location during evaluation: Phase II Anesthesia Type: General Level of consciousness: awake Pain management: pain level controlled Vital Signs Assessment: post-procedure vital signs reviewed and stable Respiratory status: spontaneous breathing Cardiovascular status: blood pressure returned to baseline Anesthetic complications: no   No complications documented.   Last Vitals:  Vitals:   04/02/20 1248 04/02/20 1430  BP: (!) 144/92   Pulse: 72 70  Resp: (!) 21 20  Temp: 36.7 C 36.7 C  SpO2: 97% 100%    Last Pain:  Vitals:   04/02/20 1430  TempSrc: Oral  PainSc: 0-No pain                 Windell Norfolk

## 2020-04-02 NOTE — Transfer of Care (Signed)
Immediate Anesthesia Transfer of Care Note  Patient: Becky Henry  Procedure(s) Performed: COLONOSCOPY WITH PROPOFOL (N/A )  Patient Location: Endoscopy Unit  Anesthesia Type:General  Level of Consciousness: awake and oriented  Airway & Oxygen Therapy: Patient Spontanous Breathing  Post-op Assessment: Report given to RN and Post -op Vital signs reviewed and stable  Post vital signs: Reviewed and stable  Last Vitals:  Vitals Value Taken Time  BP    Temp    Pulse    Resp    SpO2      Last Pain:  Vitals:   04/02/20 1410  TempSrc:   PainSc: 0-No pain      Patients Stated Pain Goal: 9 (04/02/20 1248)  Complications: No complications documented.

## 2020-04-02 NOTE — Anesthesia Postprocedure Evaluation (Signed)
Anesthesia Post Note  Patient: Becky Henry  Procedure(s) Performed: COLONOSCOPY WITH PROPOFOL (N/A )  Patient location during evaluation: Endoscopy Anesthesia Type: General Level of consciousness: awake and alert and oriented Pain management: pain level controlled Vital Signs Assessment: post-procedure vital signs reviewed and stable Respiratory status: spontaneous breathing, nonlabored ventilation and respiratory function stable Cardiovascular status: blood pressure returned to baseline and stable Postop Assessment: no apparent nausea or vomiting and adequate PO intake Anesthetic complications: no   No complications documented.   Last Vitals:  Vitals:   04/02/20 1248 04/02/20 1430  BP: (!) 144/92   Pulse: 72 70  Resp: (!) 21 20  Temp: 36.7 C 36.7 C  SpO2: 97% 100%    Last Pain:  Vitals:   04/02/20 1430  TempSrc: Oral  PainSc: 0-No pain                 Julian Reil

## 2020-04-02 NOTE — Op Note (Signed)
[facility] Patient Name: Becky Henry Procedure Date: 04/02/2020 2:07 PM MRN: [id number] Date of Birth: 01-20-1971 Attending MD: Elon Alas. Edgar Frisk CSN: [id number] Age: 49 Admit Type: Outpatient Procedure:                Colonoscopy Indications:              Screening for colorectal malignant neoplasm Providers:                Elon Alas. Abbey Chatters, DO, Crystal Page, Wynonia Musty Tech, Technician Referring MD:              Medicines:                See the Anesthesia note for documentation of the                            administered medications Complications:            No immediate complications. Estimated Blood Loss:     Estimated blood loss was minimal. Procedure:                Pre-Anesthesia Assessment:                           - The anesthesia plan was to use monitored                            anesthesia care (MAC).                           After obtaining informed consent, the colonoscope                            was passed under direct vision. Throughout the                            procedure, the patient's blood pressure, pulse, and                            oxygen saturations were monitored continuously. The                            [id number]-HQ190L ([id number]) scope was introduced through                            the anus and advanced to the the terminal ileum,                            with identification of the appendiceal orifice and                            IC valve. The colonoscopy was performed without                            difficulty. The patient tolerated the  procedure                            well. The quality of the bowel preparation was                            evaluated using the BBPS [facility] Bowel Preparation                            Scale) with scores of: Right Colon = 3, Transverse                            Colon = 3 and Left Colon = 3 (entire mucosa seen                            well with no  residual staining, small fragments of                            stool or opaque liquid). The total BBPS score                            equals 9. Scope In: 2:15:29 PM Scope Out: 2:27:27 PM Scope Withdrawal Time: 0 hours 5 minutes 57 seconds  Total Procedure Duration: 0 hours 11 minutes 58 seconds  Findings:      The perianal and digital rectal examinations were normal.      Non-bleeding internal hemorrhoids were found during endoscopy.      The terminal ileum appeared normal.      The entire examined colon appeared normal. Impression:               - Non-bleeding internal hemorrhoids.                           - The examined portion of the ileum was normal.                           - The entire examined colon is normal.                           - No specimens collected. Moderate Sedation:      Per Anesthesia Care Recommendation:           - Patient has a contact number available for                            emergencies. The signs and symptoms of potential                            delayed complications were discussed with the                            patient. Return to normal activities tomorrow.                            Written discharge instructions were provided to the  patient.                           - Resume previous diet.                           - Continue present medications.                           - Await pathology results.                           - Repeat colonoscopy in 10 years for screening                            purposes.                           - Return to GI clinic PRN. Procedure Code(s):        --- Professional ---                           Y6503, Colorectal cancer screening; colonoscopy on                            individual not meeting criteria for high risk Diagnosis Code(s):        --- Professional ---                           Z12.11, Encounter for screening for malignant                            neoplasm  of colon                           K64.8, Other hemorrhoids CPT copyright 2019 American Medical Association. All rights reserved. The codes documented in this report are preliminary and upon coder review may  be revised to meet current compliance requirements. Elon Alas. Abbey Chatters, DO [place] Abbey Chatters, DO 04/02/2020 2:33:52 PM This report has been signed electronically. Number of Addenda: 0

## 2020-04-02 NOTE — H&P (Signed)
Primary Care Physician:  Adline Potter, NP Primary Gastroenterologist:  Dr. Marletta Lor  Pre-Procedure History & Physical: HPI:  Becky Henry is a 49 y.o. female is here for a colonoscopy for colon cancer screening purposes.  Patient denies any family history of colorectal cancer.  No melena or hematochezia.  No abdominal pain or unintentional weight loss.  No change in bowel habits.  Overall feels well from a GI standpoint.  Past Medical History:  Diagnosis Date  . Abnormal Pap smear   . Abnormal uterine bleeding (AUB) 09/24/2015  . Anemia   . Elevated triglycerides with high cholesterol 01/07/2018  . Endometrial polyp 08/26/2013  . Herpes simplex without mention of complication   . Hypertension   . Insomnia 09/25/2013  . Labial lesion 01/28/2013  . Left ovarian cyst 08/26/2013  . LLQ pain 09/24/2015  . PMS (premenstrual syndrome) 11/03/2014  . Stress at home 11/03/2014  . Unspecified symptom associated with female genital organs 08/26/2013  . Vaginal Pap smear, abnormal     Past Surgical History:  Procedure Laterality Date  . DILITATION & CURRETTAGE/HYSTROSCOPY WITH THERMACHOICE ABLATION N/A 10/24/2013   Procedure: DILATATION & CURETTAGE/HYSTEROSCOPY WITH THERMACHOICE ABLATION;  Surgeon: Lazaro Arms, MD;  Location: [facility];  Service: Gynecology;  Laterality: N/A;  16 ml in  16 ml out temperature 87 degree celcius total therapy time and 11 seconds  . POLYPECTOMY N/A 10/24/2013   Procedure: ENDOMETRIAL POLYPECTOMY;  Surgeon: Lazaro Arms, MD;  Location: [facility];  Service: Gynecology;  Laterality: N/A;  . TUBAL LIGATION      Prior to Admission medications   Medication Sig Start Date End Date Taking? Authorizing Provider  losartan (COZAAR) 50 MG tablet Take 1 tablet (50 mg total) by mouth daily. 07/23/19  Yes Cyril Mourning A, NP  meloxicam (MOBIC) 15 MG tablet Take 15 mg by mouth daily. 07/05/19  Yes [provider]  traMADol (ULTRAM) 50 MG tablet Take 50 mg by mouth 2  (two) times daily as needed. 11/21/19  Yes [provider]  HUMIRA PEN 40 MG/0.4ML PNKT once a week.  07/17/19   [provider]  ibuprofen (ADVIL) 800 MG tablet TAKE 1 TABLET EVERY 8 HOURS AS NEEDED 03/26/20   Adline Potter, NP    Allergies as of 03/03/2020 - Review Complete 02/23/2020  Allergen Reaction Noted  . Sulfa antibiotics Hives and Swelling 01/28/2013    Family History  Problem Relation Age of Onset  . Diabetes Mother   . Mental illness Mother   . Schizophrenia Mother   . Dementia Mother   . Alzheimer's disease Mother   . Diabetes Father   . Cancer Father   . Diabetes Maternal Grandmother   . Diabetes Maternal Grandfather   . Diabetes Paternal Grandmother   . Diabetes Paternal Grandfather     Social History   Socioeconomic History  . Marital status: Divorced    Spouse name: Not on file  . Number of children: Not on file  . Years of education: Not on file  . Highest education level: Not on file  Occupational History  . Not on file  Tobacco Use  . Smoking status: Never Smoker  . Smokeless tobacco: Never Used  Vaping Use  . Vaping Use: Never used  Substance and Sexual Activity  . Alcohol use: No  . Drug use: No  . Sexual activity: Not Currently    Partners: Male    Birth control/protection: Surgical    Comment: tubal and ablation  Other Topics Concern  . Not on file  Social History Narrative  . Not on file   Social Determinants of Health   Financial Resource Strain: Medium Risk  . Difficulty of Paying Living Expenses: Somewhat hard  Food Insecurity: No Food Insecurity  . Worried About Programme researcher, broadcasting/film/video in the Last Year: Never true  . Ran Out of Food in the Last Year: Never true  Transportation Needs: No Transportation Needs  . Lack of Transportation (Medical): No  . Lack of Transportation (Non-Medical): No  Physical Activity: Sufficiently Active  . Days of Exercise per Week: 7 days  . Minutes of Exercise per Session: 100 min   Stress: No Stress Concern Present  . Feeling of Stress : Not at all  Social Connections: Moderately Isolated  . Frequency of Communication with Friends and Family: More than three times a week  . Frequency of Social Gatherings with Friends and Family: Once a week  . Attends Religious Services: More than 4 times per year  . Active Member of Clubs or Organizations: No  . Attends Banker Meetings: Never  . Marital Status: Divorced  Catering manager Violence: Not At Risk  . Fear of Current or Ex-Partner: No  . Emotionally Abused: No  . Physically Abused: No  . Sexually Abused: No    Review of Systems: See HPI, otherwise negative ROS  Impression/Plan: Becky Henry is here for a colonoscopy to be performed for colon cancer screening purposes.  The risks of the procedure including infection, bleed, or perforation as well as benefits, limitations, alternatives and imponderables have been reviewed with the patient. Questions have been answered. All parties agreeable.

## 2020-04-02 NOTE — Anesthesia Preprocedure Evaluation (Signed)
Anesthesia Evaluation  Patient identified by MRN, date of birth, ID band Patient awake    Reviewed: Allergy & Precautions, H&P , NPO status , Patient's Chart, lab work & pertinent test results, reviewed documented beta blocker date and time   Airway Mallampati: II  TM Distance: >3 FB Neck ROM: full    Dental no notable dental hx.    Pulmonary neg pulmonary ROS,    Pulmonary exam normal breath sounds clear to auscultation       Cardiovascular Exercise Tolerance: Good hypertension, negative cardio ROS   Rhythm:regular Rate:Normal     Neuro/Psych PSYCHIATRIC DISORDERS Depression negative neurological ROS     GI/Hepatic negative GI ROS, Neg liver ROS,   Endo/Other  negative endocrine ROS  Renal/GU negative Renal ROS  negative genitourinary   Musculoskeletal   Abdominal   Peds  Hematology  (+) Blood dyscrasia, anemia ,   Anesthesia Other Findings   Reproductive/Obstetrics negative OB ROS                             Anesthesia Physical Anesthesia Plan  ASA: II  Anesthesia Plan: General   Post-op Pain Management:    Induction:   PONV Risk Score and Plan: Propofol infusion  Airway Management Planned:   Additional Equipment:   Intra-op Plan:   Post-operative Plan:   Informed Consent: I have reviewed the patients History and Physical, chart, labs and discussed the procedure including the risks, benefits and alternatives for the proposed anesthesia with the patient or authorized representative who has indicated his/her understanding and acceptance.     Dental Advisory Given  Plan Discussed with: CRNA  Anesthesia Plan Comments:         Anesthesia Quick Evaluation

## 2020-04-02 NOTE — Anesthesia Procedure Notes (Signed)
Date/Time: 04/02/2020 2:18 PM Performed by: Julian Reil, CRNA Pre-anesthesia Checklist: Emergency Drugs available, Suction available, Patient identified and Patient being monitored Patient Re-evaluated:Patient Re-evaluated prior to induction Oxygen Delivery Method: Nasal cannula Induction Type: IV induction Placement Confirmation: positive ETCO2

## 2020-04-13 ENCOUNTER — Encounter (HOSPITAL_COMMUNITY): Payer: Self-pay | Admitting: Internal Medicine

## 2020-06-04 DIAGNOSIS — M21622 Bunionette of left foot: Secondary | ICD-10-CM | POA: Insufficient documentation

## 2020-06-24 ENCOUNTER — Encounter: Payer: Self-pay | Admitting: *Deleted

## 2020-07-27 ENCOUNTER — Encounter: Payer: Self-pay | Admitting: Adult Health

## 2020-07-27 ENCOUNTER — Other Ambulatory Visit: Payer: Self-pay

## 2020-07-27 ENCOUNTER — Ambulatory Visit (INDEPENDENT_AMBULATORY_CARE_PROVIDER_SITE_OTHER): Payer: BC Managed Care – PPO | Admitting: Adult Health

## 2020-07-27 VITALS — BP 127/84 | HR 68 | Ht 61.5 in | Wt 153.5 lb

## 2020-07-27 DIAGNOSIS — I499 Cardiac arrhythmia, unspecified: Secondary | ICD-10-CM | POA: Insufficient documentation

## 2020-07-27 DIAGNOSIS — F32A Depression, unspecified: Secondary | ICD-10-CM

## 2020-07-27 DIAGNOSIS — N816 Rectocele: Secondary | ICD-10-CM

## 2020-07-27 DIAGNOSIS — Z01419 Encounter for gynecological examination (general) (routine) without abnormal findings: Secondary | ICD-10-CM | POA: Diagnosis not present

## 2020-07-27 DIAGNOSIS — Z1211 Encounter for screening for malignant neoplasm of colon: Secondary | ICD-10-CM | POA: Insufficient documentation

## 2020-07-27 DIAGNOSIS — I10 Essential (primary) hypertension: Secondary | ICD-10-CM | POA: Diagnosis not present

## 2020-07-27 DIAGNOSIS — E782 Mixed hyperlipidemia: Secondary | ICD-10-CM

## 2020-07-27 DIAGNOSIS — Z1231 Encounter for screening mammogram for malignant neoplasm of breast: Secondary | ICD-10-CM

## 2020-07-27 LAB — HEMOCCULT GUIAC POC 1CARD (OFFICE): Fecal Occult Blood, POC: NEGATIVE

## 2020-07-27 MED ORDER — FLUTICASONE PROPIONATE 50 MCG/ACT NA SUSP: 2.0000 | Freq: Every day | NASAL | 3 refills | Status: DC

## 2020-07-27 MED ORDER — HYDROCHLOROTHIAZIDE 12.5 MG PO CAPS: 12.5000 mg | ORAL_CAPSULE | Freq: Every day | ORAL | 3 refills | Status: DC

## 2020-07-27 MED ORDER — LOSARTAN POTASSIUM 50 MG PO TABS: 50.0000 mg | ORAL_TABLET | Freq: Every day | ORAL | 3 refills | Status: DC

## 2020-07-27 MED ORDER — VALACYCLOVIR HCL 1 G PO TABS: 1000.0000 mg | ORAL_TABLET | Freq: Every day | ORAL | 3 refills | Status: DC

## 2020-07-27 NOTE — Progress Notes (Addendum)
Patient ID: Becky Henry, female   DOB: 07-09-1970, 50 y.o.   MRN: [id number] History of Present Illness:  Becky Henry is a 50 year old white female, divorced, [id number], in for well woman gyn exam, she had a normal pap with negative HPV 07/23/19. She requests refill on Flonase and valtrex. She sees rheumatology, but has no PCP.  Current Medications, Allergies, Past Medical History, Past Surgical History, Family History and Social History were reviewed in [employer] record.     Review of Systems: Patient denies any headaches, hearing loss, fatigue, blurred vision, shortness of breath, chest pain, abdominal pain, problems with bowel movements, urination, or intercourse. No mood swings. Has pain in feet(to see foot doctor) and under arm at times Has irregular heart beat at times, on BP monitor   Physical Exam:BP 127/84 (BP Location: Left Arm, Patient Position: Sitting, Cuff Size: Normal)   Pulse 68   Ht 5' 1.5" (1.562 m)   Wt 153 lb 8 oz (69.6 kg)   BMI 28.53 kg/m  General:  Well developed, well nourished, no acute distress Skin:  Warm and dry Neck:  Midline trachea, normal thyroid, good ROM, no lymphadenopathy Lungs; Clear to auscultation bilaterally Breast:  No dominant palpable mass, retraction, or nipple discharge Cardiovascular: Regular rate and rhythm Abdomen:  Soft, non tender, no hepatosplenomegaly Pelvic:  External genitalia is normal in appearance, no lesions.  The vagina is normal in appearance. Urethra has no lesions or masses. The cervix is bulbous.  Uterus is felt to be normal size, shape, and contour.  No adnexal masses or tenderness noted.Bladder is non tender, no masses felt. Rectal: Good sphincter tone, no polyps, or hemorrhoids felt.  Hemoccult negative.+rectocele Extremities/musculoskeletal:  No swelling or varicosities noted, no clubbing or cyanosis Psych:  No mood changes, alert and cooperative,seems happy AA is 0 Fall risk is low PHQ 9 score is  10 GAD 7 score is 4  Upstream - 07/27/20 1553      Pregnancy Intention Screening   Does the patient want to become pregnant in the next year? N/A    Does the patient's partner want to become pregnant in the next year? N/A    Would the patient like to discuss contraceptive options today? N/A      Contraception Wrap Up   Current Method Female Sterilization    End Method Female Sterilization    Contraception Counseling Provided No         Examination chaperoned by Malachy Mood LPN.   Impression and Plan: 1. Encounter for well woman exam with routine gynecological exam Physical in 1 year Pap in 2024 Colonoscopy per GI I gave her number to call Dr Al Corpus needs PCP  - CBC - Comprehensive metabolic panel - TSH  2. Encounter for screening fecal occult blood testing - POCT occult blood stool  3. Essential hypertension Refilled losartan and microzide Meds ordered this encounter  Medications  . losartan (COZAAR) 50 MG tablet    Sig: Take 1 tablet (50 mg total) by mouth daily.    Dispense:  90 tablet    Refill:  3    Order Specific Question:   Supervising Provider    Answer:   Despina Hidden, LUTHER H [[id number]]  . valACYclovir (VALTREX) 1000 MG tablet    Sig: Take 1 tablet (1,000 mg total) by mouth daily.    Dispense:  90 tablet    Refill:  3    Order Specific Question:   Supervising Provider    Answer:  EURE, LUTHER H [[id number]]  . fluticasone (FLONASE) 50 MCG/ACT nasal spray    Sig: Place 2 sprays into both nostrils daily.    Dispense:  18.2 mL    Refill:  3    Order Specific Question:   Supervising Provider    Answer:   EURE, LUTHER H [[id number]]  . hydrochlorothiazide (MICROZIDE) 12.5 MG capsule    Sig: Take 1 capsule (12.5 mg total) by mouth daily.    Dispense:  90 capsule    Refill:  3    Order Specific Question:   Supervising Provider    Answer:   Despina Hidden, LUTHER H [[id number]]    4. Rectocele   5. Depression, unspecified depression type   6. Irregular heart rate - Comprehensive  metabolic panel - TSH - Ambulatory referral to Cardiology  7. Elevated triglycerides with high cholesterol - Lipid panel  8. Screening mammogram for breast cancer - MM 3D SCREEN BREAST BILATERAL; Future Call for appointment at [facility]

## 2020-07-28 ENCOUNTER — Other Ambulatory Visit: Payer: Self-pay | Admitting: Adult Health

## 2020-07-28 LAB — LIPID PANEL
Chol/HDL Ratio: 3.7 ratio (ref 0.0–4.4)
Cholesterol, Total: 191 mg/dL (ref 100–199)
HDL: 51 mg/dL (ref >39)
LDL Chol Calc (NIH): 91 mg/dL (ref 0–99)
Triglycerides: 299 mg/dL — ABNORMAL HIGH (ref 0–149)
VLDL Cholesterol Cal: 49 mg/dL — ABNORMAL HIGH (ref 5–40)

## 2020-07-28 LAB — COMPREHENSIVE METABOLIC PANEL
ALT: 12 IU/L (ref 0–32)
AST: 17 IU/L (ref 0–40)
Albumin/Globulin Ratio: 1.7 (ref 1.2–2.2)
Albumin: 4.5 g/dL (ref 3.8–4.8)
Alkaline Phosphatase: 85 IU/L (ref 44–121)
BUN/Creatinine Ratio: 22 (ref 9–23)
BUN: 17 mg/dL (ref 6–24)
Bilirubin Total: <0.2 mg/dL (ref 0.0–1.2)
CO2: 21 mmol/L (ref 20–29)
Calcium: 9.5 mg/dL (ref 8.7–10.2)
Chloride: 103 mmol/L (ref 96–106)
Creatinine, Ser: 0.78 mg/dL (ref 0.57–1.00)
Globulin, Total: 2.6 g/dL (ref 1.5–4.5)
Glucose: 119 mg/dL — ABNORMAL HIGH (ref 65–99)
Potassium: 4.1 mmol/L (ref 3.5–5.2)
Sodium: 140 mmol/L (ref 134–144)
Total Protein: 7.1 g/dL (ref 6.0–8.5)
eGFR: 92 mL/min/{1.73_m2} (ref >59)

## 2020-07-28 LAB — CBC
Hematocrit: 39 % (ref 34.0–46.6)
Hemoglobin: 12.9 g/dL (ref 11.1–15.9)
MCH: 30.1 pg (ref 26.6–33.0)
MCHC: 33.1 g/dL (ref 31.5–35.7)
MCV: 91 fL (ref 79–97)
Platelets: 277 10*3/uL (ref 150–450)
RBC: 4.28 x10E6/uL (ref 3.77–5.28)
RDW: 12.9 % (ref 11.7–15.4)
WBC: 6.8 10*3/uL (ref 3.4–10.8)

## 2020-07-28 LAB — TSH: TSH: 0.794 u[IU]/mL (ref 0.450–4.500)

## 2020-10-27 ENCOUNTER — Telehealth: Payer: Self-pay | Admitting: Adult Health

## 2020-10-27 MED ORDER — FLUTICASONE PROPIONATE 50 MCG/ACT NA SUSP: 2.0000 | Freq: Every day | NASAL | 3 refills | Status: DC

## 2020-10-27 NOTE — Telephone Encounter (Signed)
Refilled flonase  

## 2020-10-27 NOTE — Addendum Note (Signed)
Addended by: Cyril Mourning A on: 10/27/2020 01:33 PM   Modules accepted: Orders

## 2020-10-27 NOTE — Telephone Encounter (Signed)
Patient is calling asking for a refill for allergy meds to be sent to her [facility] mail pharmacy

## 2021-06-30 ENCOUNTER — Other Ambulatory Visit: Payer: Self-pay | Admitting: Adult Health

## 2021-08-02 ENCOUNTER — Other Ambulatory Visit: Payer: Self-pay | Admitting: Adult Health

## 2021-08-03 ENCOUNTER — Other Ambulatory Visit: Payer: Self-pay | Admitting: Women's Health

## 2021-08-29 ENCOUNTER — Telehealth: Payer: Self-pay | Admitting: Internal Medicine

## 2021-08-29 ENCOUNTER — Encounter: Payer: Self-pay | Admitting: Internal Medicine

## 2021-08-29 ENCOUNTER — Ambulatory Visit: Payer: BC Managed Care – PPO | Admitting: Internal Medicine

## 2021-08-29 VITALS — BP 122/82 | HR 70 | Resp 18 | Ht 61.5 in | Wt 150.8 lb

## 2021-08-29 DIAGNOSIS — M159 Polyosteoarthritis, unspecified: Secondary | ICD-10-CM

## 2021-08-29 DIAGNOSIS — Z1329 Encounter for screening for other suspected endocrine disorder: Secondary | ICD-10-CM

## 2021-08-29 DIAGNOSIS — Z1159 Encounter for screening for other viral diseases: Secondary | ICD-10-CM | POA: Diagnosis not present

## 2021-08-29 DIAGNOSIS — M1991 Primary osteoarthritis, unspecified site: Secondary | ICD-10-CM | POA: Insufficient documentation

## 2021-08-29 DIAGNOSIS — G4700 Insomnia, unspecified: Secondary | ICD-10-CM

## 2021-08-29 DIAGNOSIS — E782 Mixed hyperlipidemia: Secondary | ICD-10-CM

## 2021-08-29 DIAGNOSIS — M0600 Rheumatoid arthritis without rheumatoid factor, unspecified site: Secondary | ICD-10-CM | POA: Diagnosis not present

## 2021-08-29 DIAGNOSIS — M5430 Sciatica, unspecified side: Secondary | ICD-10-CM | POA: Insufficient documentation

## 2021-08-29 DIAGNOSIS — R7303 Prediabetes: Secondary | ICD-10-CM

## 2021-08-29 DIAGNOSIS — Z114 Encounter for screening for human immunodeficiency virus [HIV]: Secondary | ICD-10-CM

## 2021-08-29 DIAGNOSIS — I10 Essential (primary) hypertension: Secondary | ICD-10-CM | POA: Diagnosis not present

## 2021-08-29 DIAGNOSIS — Z1231 Encounter for screening mammogram for malignant neoplasm of breast: Secondary | ICD-10-CM

## 2021-08-29 MED ORDER — LOSARTAN POTASSIUM 25 MG PO TABS: 25.0000 mg | ORAL_TABLET | Freq: Every day | ORAL | 1 refills | Status: DC

## 2021-08-29 MED ORDER — TRAZODONE HCL 50 MG PO TABS: 25.0000 mg | ORAL_TABLET | Freq: Every evening | ORAL | 3 refills | Status: DC | PRN

## 2021-08-29 NOTE — Progress Notes (Signed)
New Patient Office Visit  Subjective:  Patient ID: Becky Henry, female    DOB: [birth year]  Age: 51 y.o. MRN: [id number]  CC:  Chief Complaint  Patient presents with   New Patient (Initial Visit)    New patient was referred by jennifer griffin ob just establishing care     HPI Becky Henry is a 51 y.o. female with past medical history of HTN, seronegative RA, OA and insomnia who presents for establishing care.  HTN: Her BP is well controlled currently.  She was on losartan 50 mg daily and HCTZ 12.5 mg daily, but has stopped taking them as she felt that her blood pressure was WNL.  She reports that her blood pressure rises sometimes when she is stressed.  She denies any headache, dizziness, chest pain, dyspnea or palpitations currently.  Seronegative RA: She used to follow up with Rheumatology.  She was on [place], but has stopped taking it.  She was placed on Mobic and Flexeril as needed for OA as well, but has not been taking it.  She prefers to take Tylenol as needed for moderate to severe pain.  She reports insomnia, but denies any anhedonia, anxiety, SI or HI currently.  Past Medical History:  Diagnosis Date   Abnormal Pap smear    Abnormal uterine bleeding (AUB) 09/24/2015   Anemia    Elevated triglycerides with high cholesterol 01/07/2018   Endometrial polyp 08/26/2013   Herpes simplex without mention of complication    Hypertension    Insomnia 09/25/2013   Labial lesion 01/28/2013   Left ovarian cyst 08/26/2013   LLQ pain 09/24/2015   PMS (premenstrual syndrome) 11/03/2014   Rheumatoid arthritis ([place])    Stress at home 11/03/2014   Unspecified symptom associated with female genital organs 08/26/2013   Vaginal Pap smear, abnormal     Past Surgical History:  Procedure Laterality Date   COLONOSCOPY WITH PROPOFOL N/A 04/02/2020   Procedure: COLONOSCOPY WITH PROPOFOL;  Surgeon: Eloise Harman, DO;  Location: [facility];  Service: Endoscopy;  Laterality: N/A;  9:00    DILITATION & CURRETTAGE/HYSTROSCOPY WITH THERMACHOICE ABLATION N/A 10/24/2013   Procedure: DILATATION & CURETTAGE/HYSTEROSCOPY WITH THERMACHOICE ABLATION;  Surgeon: Florian Buff, MD;  Location: [facility] ORS;  Service: Gynecology;  Laterality: N/A;  16 ml in  16 ml out temperature 87 degree celcius total therapy time 65mnutes and 11 seconds   POLYPECTOMY N/A 10/24/2013   Procedure: ENDOMETRIAL POLYPECTOMY;  Surgeon: LFlorian Buff MD;  Location: [facility];  Service: Gynecology;  Laterality: N/A;   TUBAL LIGATION      Family History  Problem Relation Age of Onset   Diabetes Mother    Mental illness Mother    Schizophrenia Mother    Dementia Mother    Alzheimer's disease Mother    Diabetes Father    Cancer Father    Diabetes Maternal Grandmother    Diabetes Maternal Grandfather    Diabetes Paternal Grandmother    Diabetes Paternal Grandfather     Social History   Socioeconomic History   Marital status: Divorced    Spouse name: Not on file   Number of children: Not on file   Years of education: Not on file   Highest education level: Not on file  Occupational History   Not on file  Tobacco Use   Smoking status: Never   Smokeless tobacco: Never  Vaping Use   Vaping Use: Never used  Substance and Sexual Activity   Alcohol use:  No   Drug use: No   Sexual activity: Not Currently    Partners: Male    Birth control/protection: Surgical    Comment: tubal and ablation  Other Topics Concern   Not on file  Social History Narrative   Not on file   Social Determinants of Health   Financial Resource Strain: Not on file  Food Insecurity: Not on file  Transportation Needs: Not on file  Physical Activity: Not on file  Stress: Not on file  Social Connections: Not on file  Intimate Partner Violence: Not on file    ROS Review of Systems  Constitutional:  Negative for chills and fever.  HENT:  Negative for congestion, sinus pressure, sinus pain and sore throat.   Eyes:  Negative for  pain and discharge.  Respiratory:  Negative for cough and shortness of breath.   Cardiovascular:  Negative for chest pain and palpitations.  Gastrointestinal:  Negative for abdominal pain, diarrhea, nausea and vomiting.  Endocrine: Negative for polydipsia and polyuria.  Genitourinary:  Negative for dysuria and hematuria.  Musculoskeletal:  Positive for arthralgias, back pain and joint swelling. Negative for neck pain and neck stiffness.  Skin:  Negative for rash.  Neurological:  Negative for dizziness and weakness.  Psychiatric/Behavioral:  Positive for sleep disturbance. Negative for agitation and behavioral problems.    Objective:   Today's Vitals: BP 122/82 (BP Location: Right Arm, Patient Position: Sitting, Cuff Size: Normal)   Pulse 70   Resp 18   Ht 5' 1.5" (1.562 m)   Wt 150 lb 12.8 oz (68.4 kg)   SpO2 100%   BMI 28.03 kg/m   Physical Exam Vitals reviewed.  Constitutional:      General: She is not in acute distress.    Appearance: She is not diaphoretic.  HENT:     Head: Normocephalic and atraumatic.     Nose: Nose normal.     Mouth/Throat:     Mouth: Mucous membranes are moist.  Eyes:     General: No scleral icterus.    Extraocular Movements: Extraocular movements intact.  Cardiovascular:     Rate and Rhythm: Normal rate and regular rhythm.     Pulses: Normal pulses.     Heart sounds: Normal heart sounds. No murmur heard. Pulmonary:     Breath sounds: Normal breath sounds. No wheezing or rales.  Musculoskeletal:     Cervical back: Neck supple. No tenderness.     Right lower leg: No edema.     Left lower leg: No edema.  Skin:    General: Skin is warm.     Findings: No rash.  Neurological:     General: No focal deficit present.     Mental Status: She is alert and oriented to person, place, and time.  Psychiatric:        Mood and Affect: Mood normal.        Behavior: Behavior normal.    Assessment & Plan:   Problem List Items Addressed This Visit        Cardiovascular and Mediastinum   Essential hypertension - Primary    BP Readings from Last 1 Encounters:  08/29/21 122/82  Well-controlled Discontinue HCTZ Decreased dose of losartan to 25 mg QD Counseled for compliance with the medications Advised DASH diet and moderate exercise/walking, at least 150 mins/week      Relevant Medications   losartan (COZAAR) 25 MG tablet   Other Relevant Orders   TSH (Completed)   CMP14+EGFR (Completed)  CBC with Differential/Platelet (Completed)     Musculoskeletal and Integument   Primary osteoarthritis    Takes ibuprofen as needed Needs to follow up with Rheumatology       Seronegative rheumatoid arthritis ([place])    Has been on multiple DMARDs in the past Needs to follow up with rheumatology Advised to contact rheumatology for refill of tramadol       Relevant Orders   CMP14+EGFR (Completed)   CBC with Differential/Platelet (Completed)     Other   Insomnia    Sleep hygiene discussed, material provided Started trazodone as needed       Relevant Medications   traZODone (DESYREL) 50 MG tablet   Other Visit Diagnoses     Need for hepatitis C screening test       Relevant Orders   Hepatitis C Antibody (Completed)   Encounter for screening for HIV       Relevant Orders   HIV antibody (with reflex) (Completed)   Prediabetes       Relevant Orders   Hemoglobin A1c (Completed)   Mixed hyperlipidemia       Relevant Medications   losartan (COZAAR) 25 MG tablet   Other Relevant Orders   Lipid panel (Completed)   Screening for thyroid disorder       Relevant Orders   TSH (Completed)   Screening mammogram for breast cancer       Relevant Orders   MM 3D SCREEN BREAST BILATERAL       Outpatient Encounter Medications as of 08/29/2021  Medication Sig   fluticasone (FLONASE) 50 MCG/ACT nasal spray Place 2 sprays into both nostrils daily.   ibuprofen (ADVIL) 800 MG tablet TAKE 1 TABLET EVERY 8 HOURSAS NEEDED   traZODone  (DESYREL) 50 MG tablet Take 0.5-1 tablets (25-50 mg total) by mouth at bedtime as needed for sleep.   valACYclovir (VALTREX) 1000 MG tablet Take 1 tablet (1,000 mg total) by mouth daily.   [DISCONTINUED] hydrochlorothiazide (MICROZIDE) 12.5 MG capsule Take 1 capsule (12.5 mg total) by mouth daily.   [DISCONTINUED] losartan (COZAAR) 50 MG tablet Take 1 tablet (50 mg total) by mouth daily.   Cyclobenzaprine HCl (FLEXERIL PO) Take by mouth. (Patient not taking: Reported on 08/29/2021)   losartan (COZAAR) 25 MG tablet Take 1 tablet (25 mg total) by mouth daily.   traMADol (ULTRAM) 50 MG tablet Take 50 mg by mouth 2 (two) times daily as needed. (Patient not taking: Reported on 08/29/2021)   [DISCONTINUED] leflunomide (ARAVA) 20 MG tablet Take by mouth. (Patient not taking: Reported on 08/29/2021)   [DISCONTINUED] meloxicam (MOBIC) 15 MG tablet Take 15 mg by mouth daily. (Patient not taking: Reported on 08/29/2021)   No facility-administered encounter medications on file as of 08/29/2021.    Follow-up: Return in about 4 months (around 12/30/2021) for Annual physical.   Lindell Spar, MD

## 2021-08-29 NOTE — Telephone Encounter (Signed)
When trying to schedule pt mammogram at checkout, pt states she always has to do a diagnostic mammogram & then  ultrasound. She states she has 2 black spots on her breast and they always question it. Wants to know if you can order a diagnostic mammogram instead of a regular mammogram? ?

## 2021-08-29 NOTE — Patient Instructions (Addendum)
Please take Losartan 25 mg instead of 50 mg. Please stop taking HCTZ. ? ?Please take Trazodone for insomnia. ? ?Please maintain simple sleep hygiene. ?- Maintain dark and non-noisy environment in the bedroom. ?- Please use the bedroom for sleep and sexual activity only. ?- Do not use electronic devices in the bedroom. ?- Please take dinner at least 2 hours before bedtime. ?- Please avoid caffeinated products in the evening, including coffee, soft drinks. ?- Please try to maintain the regular sleep-wake cycle - Go to bed and wake up at the same time. ?

## 2021-08-30 ENCOUNTER — Telehealth: Payer: Self-pay | Admitting: Internal Medicine

## 2021-08-30 ENCOUNTER — Other Ambulatory Visit: Payer: Self-pay | Admitting: *Deleted

## 2021-08-30 DIAGNOSIS — R928 Other abnormal and inconclusive findings on diagnostic imaging of breast: Secondary | ICD-10-CM

## 2021-08-30 LAB — CMP14+EGFR
ALT: 10 IU/L (ref 0–32)
AST: 13 IU/L (ref 0–40)
Albumin/Globulin Ratio: 2 (ref 1.2–2.2)
Albumin: 4.7 g/dL (ref 3.8–4.9)
Alkaline Phosphatase: 75 IU/L (ref 44–121)
BUN/Creatinine Ratio: 14 (ref 9–23)
BUN: 12 mg/dL (ref 6–24)
Bilirubin Total: <0.2 mg/dL (ref 0.0–1.2)
CO2: 25 mmol/L (ref 20–29)
Calcium: 9.3 mg/dL (ref 8.7–10.2)
Chloride: 102 mmol/L (ref 96–106)
Creatinine, Ser: 0.83 mg/dL (ref 0.57–1.00)
Globulin, Total: 2.3 g/dL (ref 1.5–4.5)
Glucose: 121 mg/dL — ABNORMAL HIGH (ref 70–99)
Potassium: 3.9 mmol/L (ref 3.5–5.2)
Sodium: 141 mmol/L (ref 134–144)
Total Protein: 7 g/dL (ref 6.0–8.5)
eGFR: 85 mL/min/{1.73_m2} (ref >59)

## 2021-08-30 LAB — CBC WITH DIFFERENTIAL/PLATELET
Basophils Absolute: 0 10*3/uL (ref 0.0–0.2)
Basos: 1 %
EOS (ABSOLUTE): 0.1 10*3/uL (ref 0.0–0.4)
Eos: 2 %
Hematocrit: 36.3 % (ref 34.0–46.6)
Hemoglobin: 12.4 g/dL (ref 11.1–15.9)
Immature Grans (Abs): 0 10*3/uL (ref 0.0–0.1)
Immature Granulocytes: 0 %
Lymphocytes Absolute: 1.3 10*3/uL (ref 0.7–3.1)
Lymphs: 22 %
MCH: 30.4 pg (ref 26.6–33.0)
MCHC: 34.2 g/dL (ref 31.5–35.7)
MCV: 89 fL (ref 79–97)
Monocytes Absolute: 0.3 10*3/uL (ref 0.1–0.9)
Monocytes: 5 %
Neutrophils Absolute: 4.2 10*3/uL (ref 1.4–7.0)
Neutrophils: 70 %
Platelets: 250 10*3/uL (ref 150–450)
RBC: 4.08 x10E6/uL (ref 3.77–5.28)
RDW: 13.8 % (ref 11.7–15.4)
WBC: 6 10*3/uL (ref 3.4–10.8)

## 2021-08-30 LAB — HEMOGLOBIN A1C
Est. average glucose Bld gHb Est-mCnc: 108 mg/dL
Hgb A1c MFr Bld: 5.4 % (ref 4.8–5.6)

## 2021-08-30 LAB — LIPID PANEL
Chol/HDL Ratio: 4.2 ratio (ref 0.0–4.4)
Cholesterol, Total: 171 mg/dL (ref 100–199)
HDL: 41 mg/dL (ref >39)
LDL Chol Calc (NIH): 78 mg/dL (ref 0–99)
Triglycerides: 320 mg/dL — ABNORMAL HIGH (ref 0–149)
VLDL Cholesterol Cal: 52 mg/dL — ABNORMAL HIGH (ref 5–40)

## 2021-08-30 LAB — HIV ANTIBODY (ROUTINE TESTING W REFLEX): HIV Screen 4th Generation wRfx: NONREACTIVE

## 2021-08-30 LAB — TSH: TSH: 0.756 u[IU]/mL (ref 0.450–4.500)

## 2021-08-30 LAB — HEPATITIS C ANTIBODY: Hep C Virus Ab: NONREACTIVE

## 2021-08-30 NOTE — Progress Notes (Signed)
diag

## 2021-08-30 NOTE — Telephone Encounter (Signed)
This has been changed to diagnostic can call and schedule  ?

## 2021-08-30 NOTE — Telephone Encounter (Signed)
Diagnostic mammo cannot be scheduled without proper  diagnosis code per [facility] .  ?

## 2021-08-31 NOTE — Telephone Encounter (Signed)
NA NVM to let pt know screening mammo has to be done not diagnostic as last recommendation was screening mammo appt made with susan 09-09-21 at 2:00 [facility] radiology  ?

## 2021-08-31 NOTE — Telephone Encounter (Signed)
NA NVM attempt two to let pt know  ?

## 2021-09-01 ENCOUNTER — Encounter: Payer: Self-pay | Admitting: *Deleted

## 2021-09-01 NOTE — Telephone Encounter (Signed)
NA NVM attempted to let pt know

## 2021-09-01 NOTE — Assessment & Plan Note (Signed)
Takes ibuprofen as needed Needs to follow up with Rheumatology

## 2021-09-01 NOTE — Assessment & Plan Note (Signed)
Has been on multiple DMARDs in the past Needs to follow up with rheumatology Advised to contact rheumatology for refill of tramadol

## 2021-09-01 NOTE — Assessment & Plan Note (Signed)
Sleep hygiene discussed, material provided Started trazodone as needed

## 2021-09-01 NOTE — Assessment & Plan Note (Signed)
BP Readings from Last 1 Encounters:  08/29/21 122/82   Well-controlled Discontinue HCTZ Decreased dose of losartan to 25 mg QD Counseled for compliance with the medications Advised DASH diet and moderate exercise/walking, at least 150 mins/week

## 2021-09-09 ENCOUNTER — Encounter (HOSPITAL_COMMUNITY): Payer: Self-pay

## 2021-09-09 ENCOUNTER — Telehealth: Payer: Self-pay | Admitting: Internal Medicine

## 2021-09-09 ENCOUNTER — Ambulatory Visit (HOSPITAL_COMMUNITY)
Admission: RE | Admit: 2021-09-09 | Discharge: 2021-09-09 | Disposition: A | Discharge status: Home / Self Care | Payer: BC Managed Care – PPO | Source: Ambulatory Visit | Attending: Internal Medicine | Admitting: Internal Medicine

## 2021-09-09 DIAGNOSIS — Z1231 Encounter for screening mammogram for malignant neoplasm of breast: Secondary | ICD-10-CM | POA: Insufficient documentation

## 2021-09-09 NOTE — Telephone Encounter (Signed)
Radiology tech called and said that Becky Henry refused to have a mammogram if she couldn't get the two tests done today because she always has the mammo and then gets called back for additional testing. She was told there was no dr Desmond Lope to read the tests to determine if further tests was needed so she refused to have the mammogram. Said next time she may need to go to the breast center where it can be read and additional testing can be done if needed because she is irritated she has to get off work for the further testing when it turns out to be nothing.

## 2021-09-19 IMAGING — US US BREAST*R* LIMITED INC AXILLA
1 series · 10 of 10 positions shown · non-contrast
Comparison: Previous exams.

CLINICAL DATA: Screening recall for possible mass seen in the upper
posterior right breast on the MLO view tomograms only.

EXAM:
DIGITAL DIAGNOSTIC UNILATERAL RIGHT MAMMOGRAM WITH CAD AND TOMO
RIGHT BREAST ULTRASOUND

[Series 1: us breast*right* limited inc axilla · 0.07mm/px · 10 of 10 slices shown]
[im 1/10]
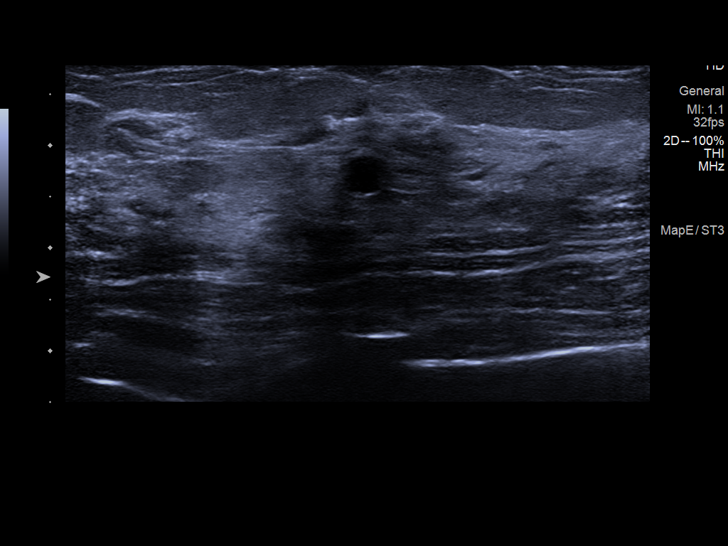
[im 2/10]
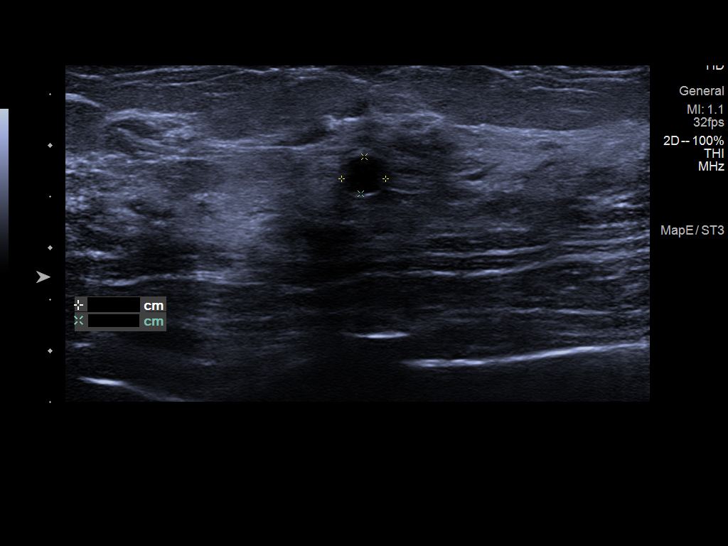
[im 3/10]
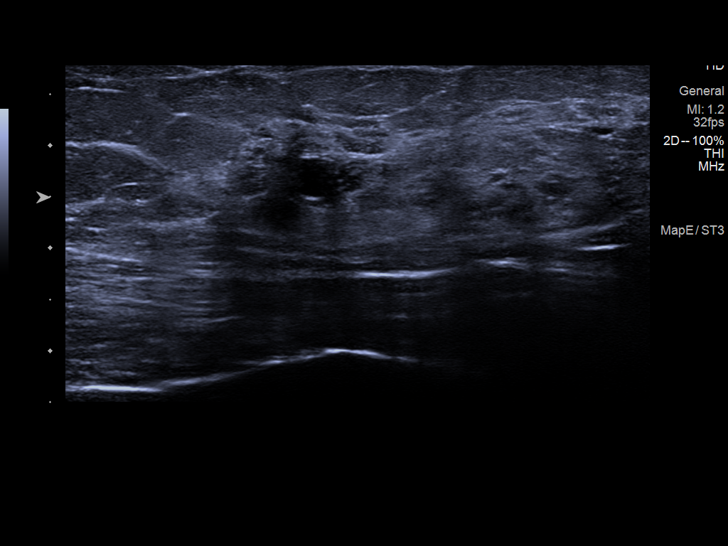
[im 4/10]
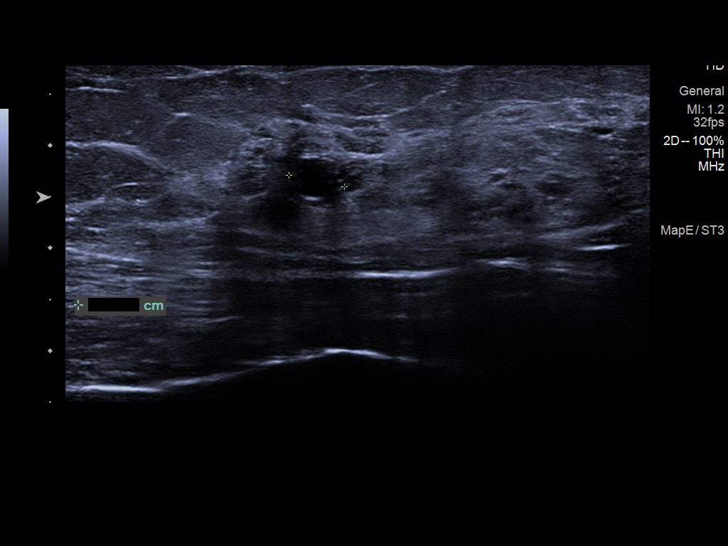
[im 5/10]
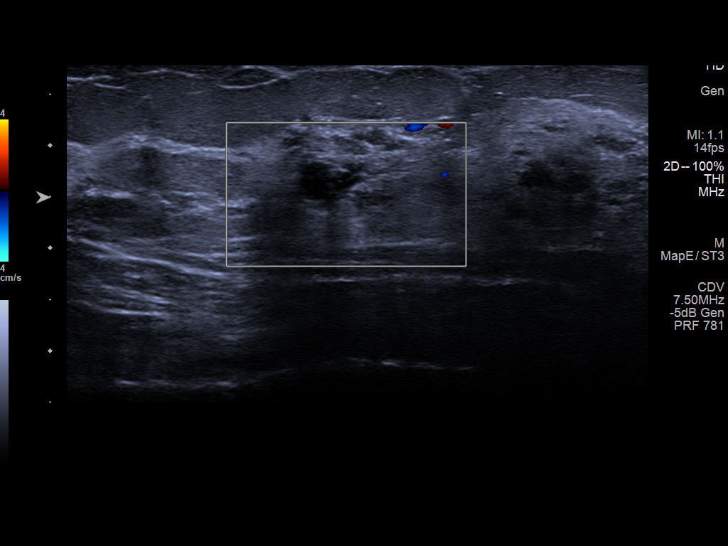
[im 6/10]
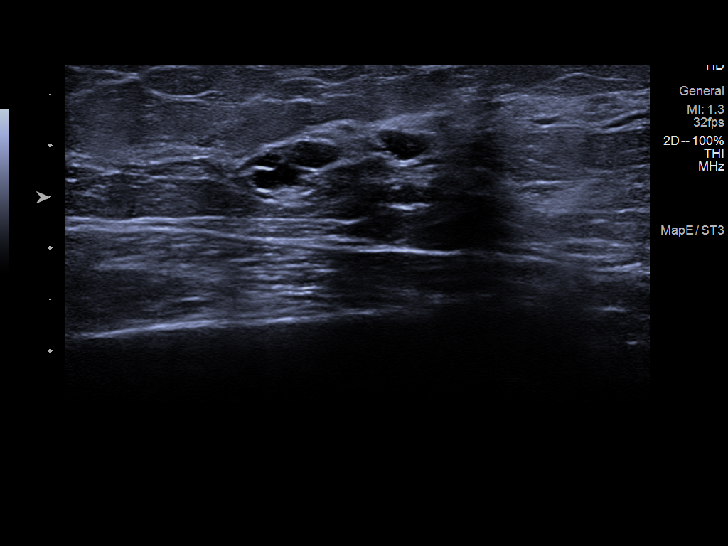
[im 7/10]
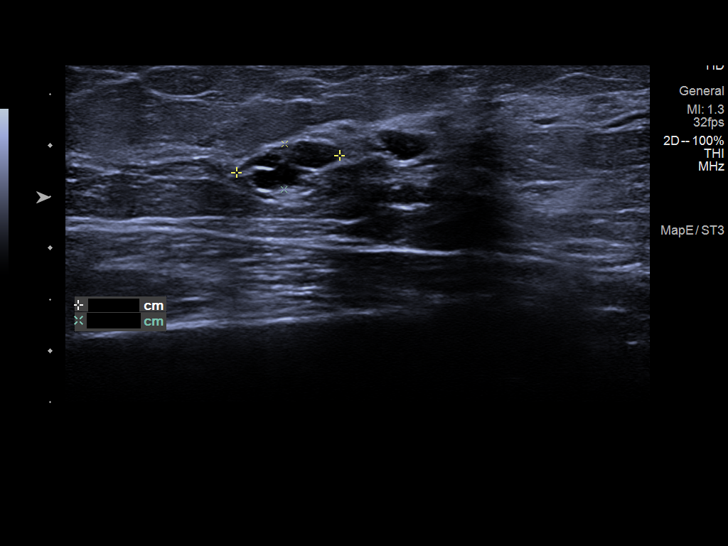
[im 8/10]
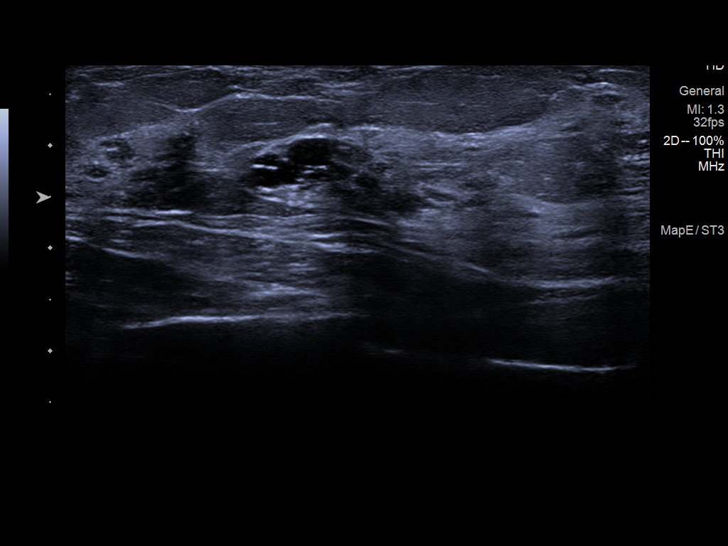
[im 9/10]
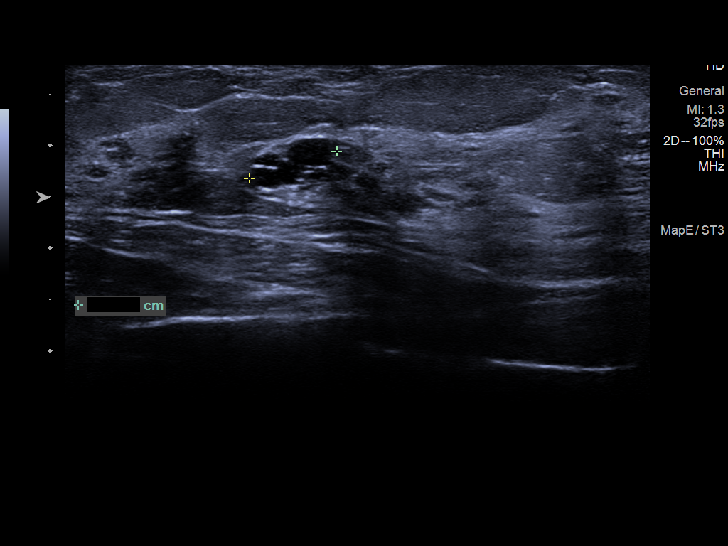
[im 10/10]
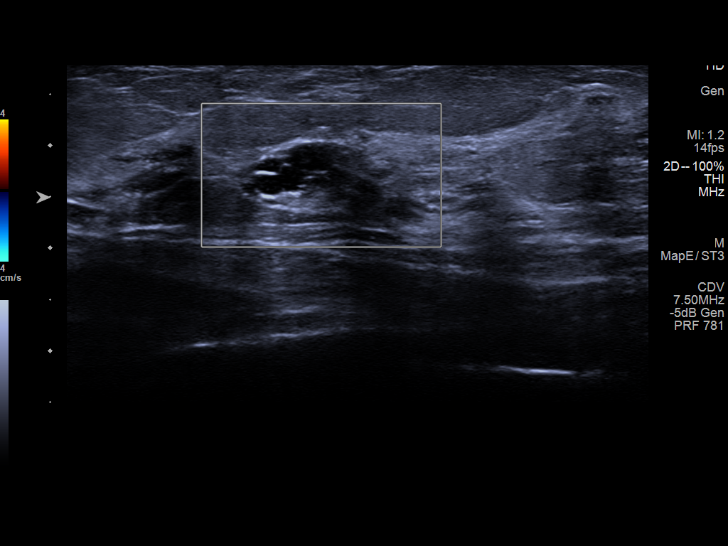

[10 of 10 positions shown; findings below may reference images not displayed]

ACR Breast Density Category c: The breast tissue is heterogeneously
dense, which may obscure small masses.
FINDINGS: Additional tomograms were performed of the right breast. There is an
oval mass with partially obscured margins in the upper slightly
outer posterior right breast measuring approximately 0.9 cm.

Mammographic images were processed with CAD.

Targeted ultrasound of the upper and outer right breast was
performed demonstrating several cysts and clusters of cysts. A cyst
at 10 o'clock 4 cm from nipple measures 0.4 x 0.4 x 0.6 cm and a
cluster of cysts at 9 o'clock 4 cm from nipple measures 1 x 0.4 x
0.9 cm. This cluster of cysts correspond with the mass seen in the
right breast at mammography. No suspicious masses or abnormality
seen in the upper are outer right breast.
IMPRESSION: Right breast cysts. There are no findings of malignancy in the right
breast.

RECOMMENDATION:
Screening mammogram in one year.(Code:U6-X-7ES)

I have discussed the findings and recommendations with the patient.
If applicable, a reminder letter will be sent to the patient
regarding the next appointment.

BI-RADS CATEGORY  2: Benign.

## 2021-09-19 IMAGING — MG MM DIGITAL DIAGNOSTIC UNILAT*R* W/ TOMO W/ CAD
4 series · 4 of 12 positions shown · non-contrast
Comparison: Previous exams.

CLINICAL DATA: Screening recall for possible mass seen in the upper
posterior right breast on the MLO view tomograms only.

EXAM:
DIGITAL DIAGNOSTIC UNILATERAL RIGHT MAMMOGRAM WITH CAD AND TOMO
RIGHT BREAST ULTRASOUND

[R MLO synth-2D]
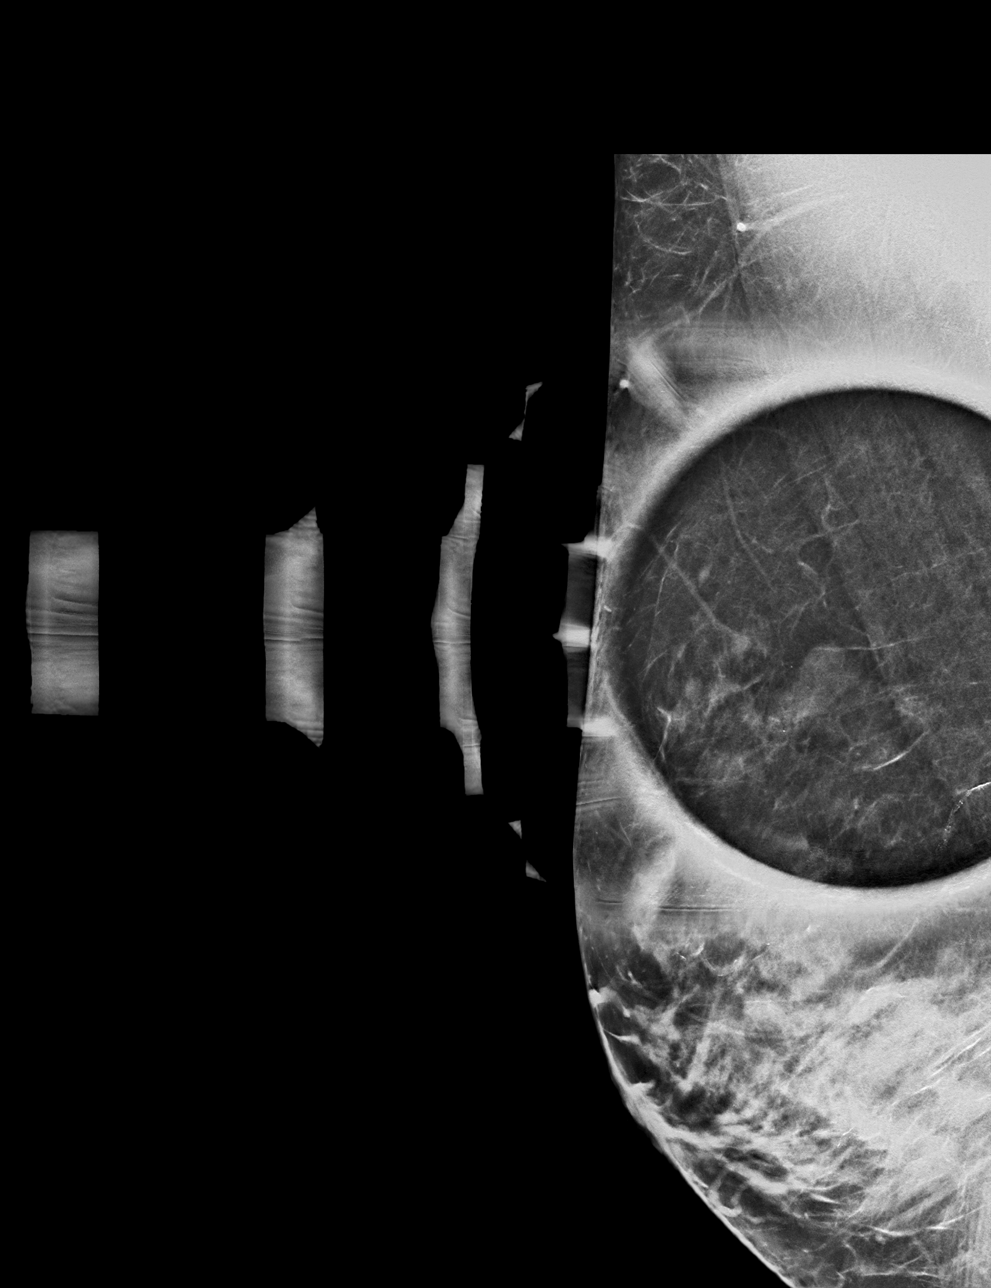

[R ML synth-2D]
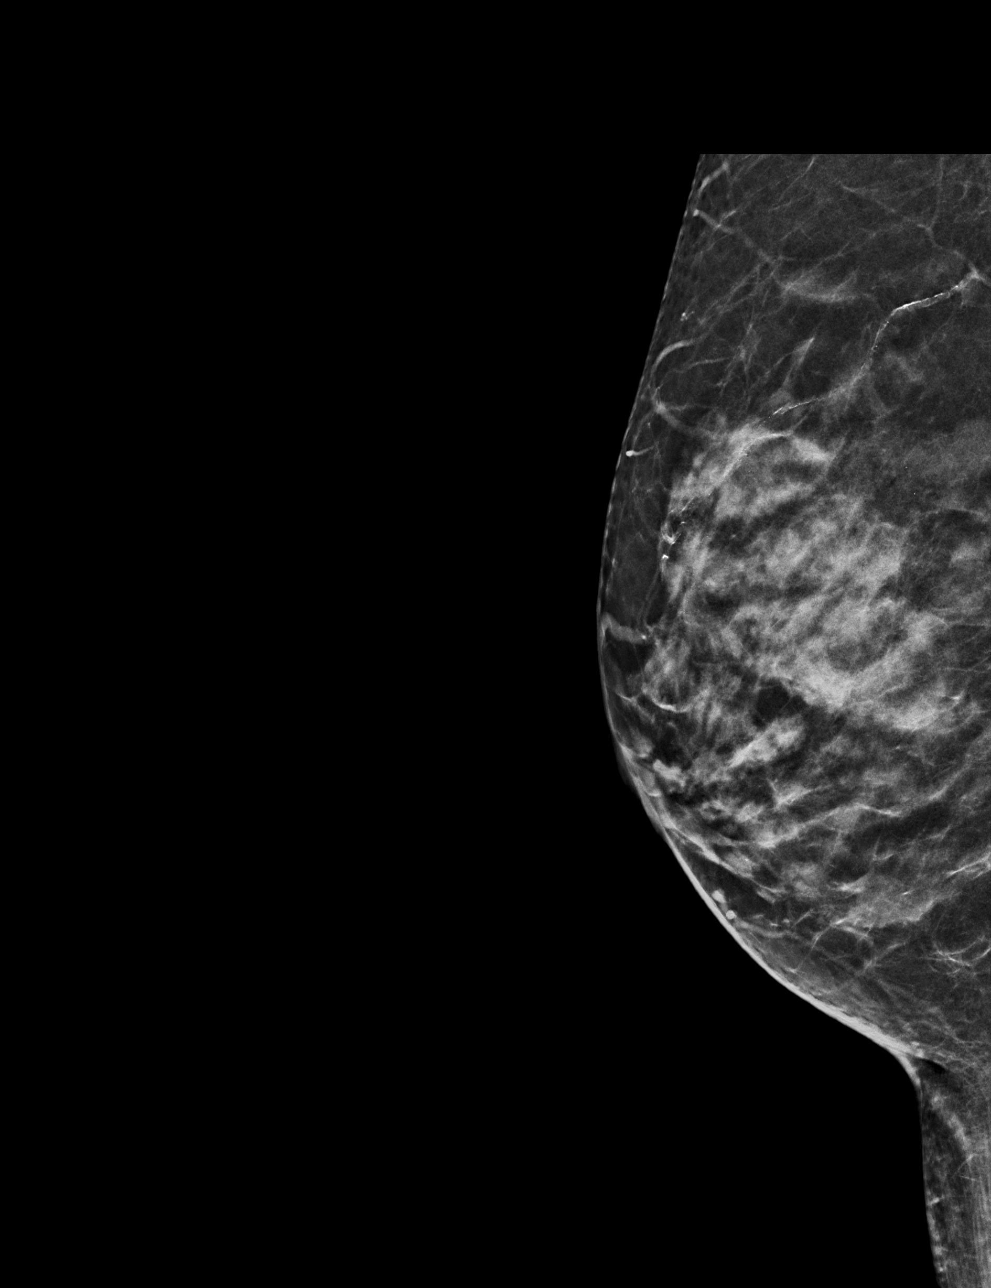

[R MLO tomo · tomo slice 25/50.0]
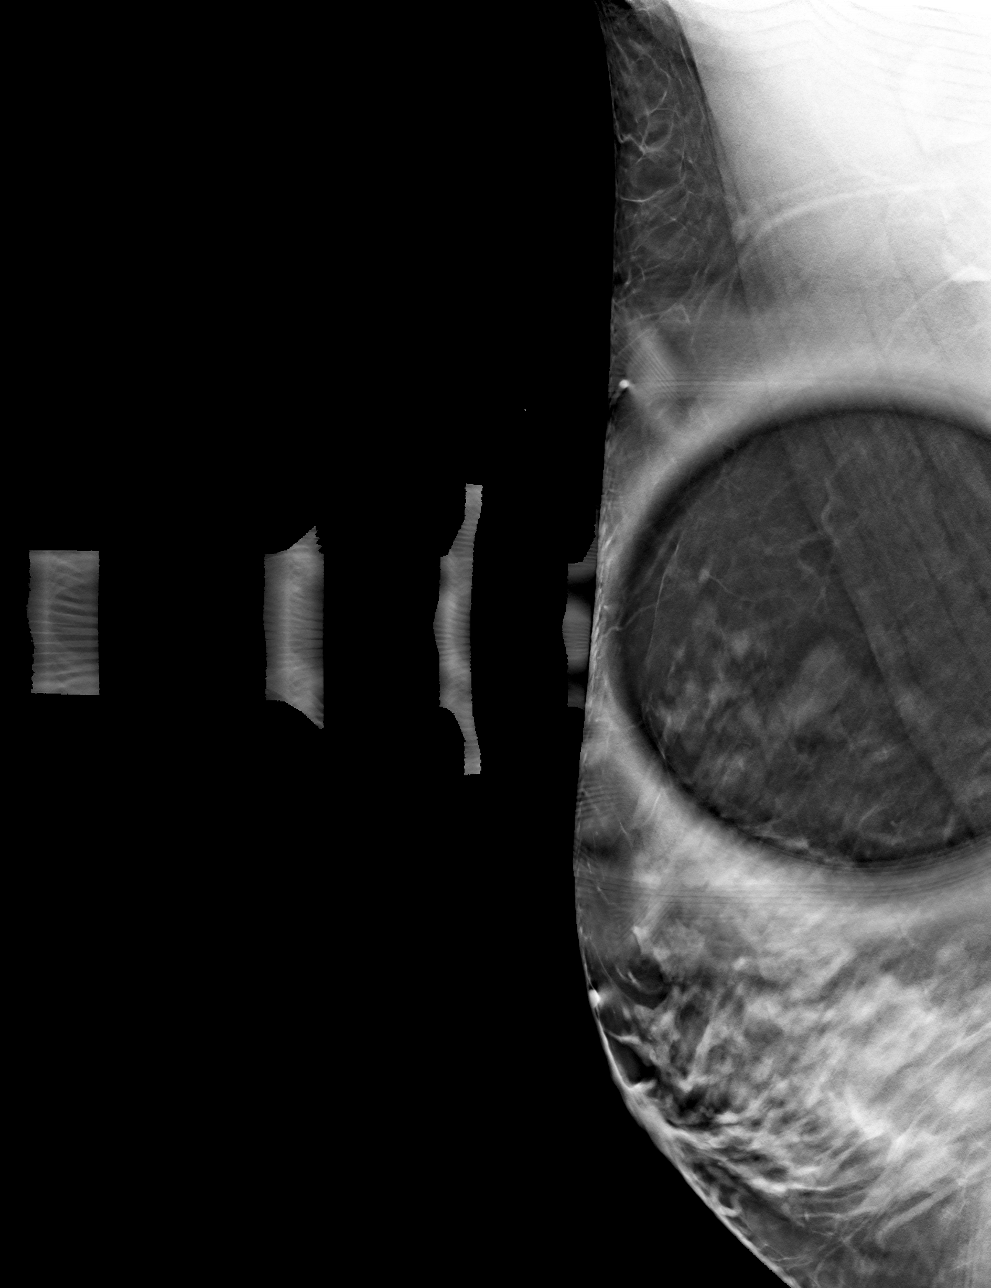

[R ML tomo · tomo slice 27/53.0]
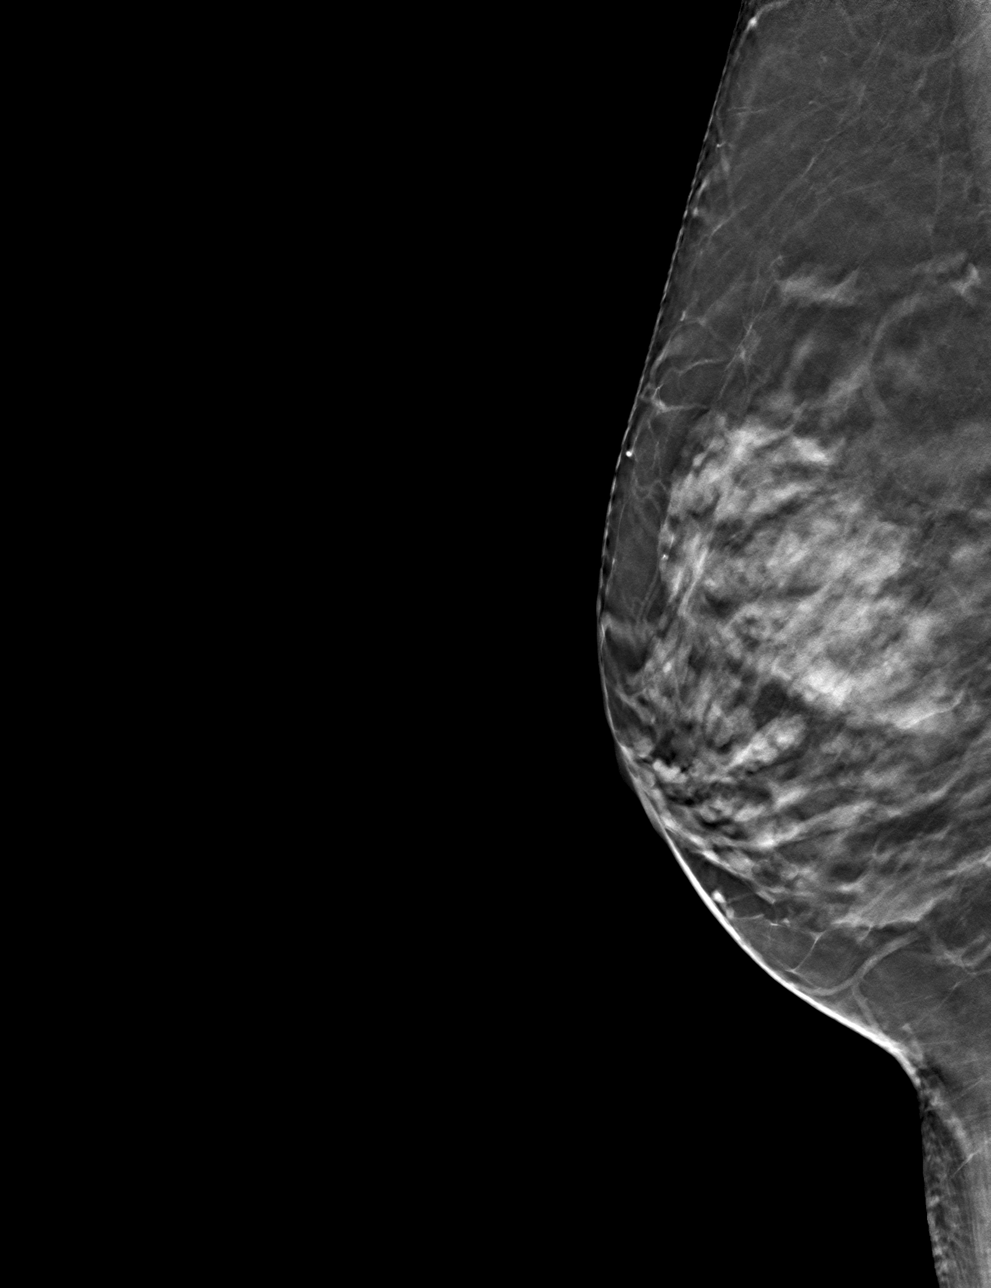

[4 of 12 positions shown; findings below may reference images not displayed]

ACR Breast Density Category c: The breast tissue is heterogeneously
dense, which may obscure small masses.
FINDINGS: Additional tomograms were performed of the right breast. There is an
oval mass with partially obscured margins in the upper slightly
outer posterior right breast measuring approximately 0.9 cm.

Mammographic images were processed with CAD.

Targeted ultrasound of the upper and outer right breast was
performed demonstrating several cysts and clusters of cysts. A cyst
at 10 o'clock 4 cm from nipple measures 0.4 x 0.4 x 0.6 cm and a
cluster of cysts at 9 o'clock 4 cm from nipple measures 1 x 0.4 x
0.9 cm. This cluster of cysts correspond with the mass seen in the
right breast at mammography. No suspicious masses or abnormality
seen in the upper are outer right breast.
IMPRESSION: Right breast cysts. There are no findings of malignancy in the right
breast.

RECOMMENDATION:
Screening mammogram in one year.(Code:U6-X-7ES)

I have discussed the findings and recommendations with the patient.
If applicable, a reminder letter will be sent to the patient
regarding the next appointment.

BI-RADS CATEGORY  2: Benign.

## 2021-12-29 ENCOUNTER — Encounter: Payer: Self-pay | Admitting: Internal Medicine

## 2021-12-29 ENCOUNTER — Ambulatory Visit (INDEPENDENT_AMBULATORY_CARE_PROVIDER_SITE_OTHER): Payer: BC Managed Care – PPO | Admitting: Internal Medicine

## 2021-12-29 VITALS — BP 128/86 | HR 66 | Resp 18 | Ht 61.5 in | Wt 163.2 lb

## 2021-12-29 DIAGNOSIS — G4700 Insomnia, unspecified: Secondary | ICD-10-CM

## 2021-12-29 DIAGNOSIS — I10 Essential (primary) hypertension: Secondary | ICD-10-CM

## 2021-12-29 DIAGNOSIS — R928 Other abnormal and inconclusive findings on diagnostic imaging of breast: Secondary | ICD-10-CM | POA: Insufficient documentation

## 2021-12-29 DIAGNOSIS — Z23 Encounter for immunization: Secondary | ICD-10-CM

## 2021-12-29 DIAGNOSIS — Z0001 Encounter for general adult medical examination with abnormal findings: Secondary | ICD-10-CM | POA: Diagnosis not present

## 2021-12-29 DIAGNOSIS — J0110 Acute frontal sinusitis, unspecified: Secondary | ICD-10-CM

## 2021-12-29 MED ORDER — AZITHROMYCIN 250 MG PO TABS: ORAL_TABLET | ORAL | 0 refills | Status: AC

## 2021-12-29 MED ORDER — FLUTICASONE PROPIONATE 50 MCG/ACT NA SUSP: 2.0000 | Freq: Every day | NASAL | 6 refills | Status: AC

## 2021-12-29 NOTE — Assessment & Plan Note (Signed)
BP Readings from Last 1 Encounters:  12/29/21 128/86   Well-controlled with losartan to 25 mg QD Counseled for compliance with the medications Advised DASH diet and moderate exercise/walking, at least 150 mins/week

## 2021-12-29 NOTE — Progress Notes (Signed)
Established Patient Office Visit  Subjective:  Patient ID: Becky Henry, female    DOB: 23-May-1970  Age: 51 y.o. MRN: [id number]  CC:  Chief Complaint  Patient presents with   Annual Exam    Annual exam having sinus drainage down throat also wants ears looked at     HPI Becky Henry is a 51 y.o. female with past medical history of HTN, seronegative RA, OA and insomnia who presents for annual physical.  HTN: Her BP is well controlled currently. She reports that her blood pressure rises sometimes when she is stressed.  She denies any headache, dizziness, chest pain, dyspnea or palpitations currently.   Seronegative RA: She used to follow up with Rheumatology.  She was on [place], but has stopped taking it.  She was placed on Mobic and Flexeril as needed for OA as well, but has not been taking it.  She prefers to take Tylenol as needed for moderate to severe pain.  She reports insomnia, but denies any anhedonia, anxiety, SI or HI currently. She takes Trazodone PRN for insomnia, but still has difficulty initiating sleep.  She complains of nasal congestion, postnasal drip, sore throat and sinus pressure related headache for the last few weeks.  She denies any fever or chills.  Denies any dyspnea or wheezing currently.   Past Medical History:  Diagnosis Date   Abnormal Pap smear    Abnormal uterine bleeding (AUB) 09/24/2015   Anemia    Elevated triglycerides with high cholesterol 01/07/2018   Endometrial polyp 08/26/2013   Herpes simplex without mention of complication    Hypertension    Insomnia 09/25/2013   Labial lesion 01/28/2013   Left ovarian cyst 08/26/2013   LLQ pain 09/24/2015   PMS (premenstrual syndrome) 11/03/2014   Rheumatoid arthritis ([place])    Stress at home 11/03/2014   Unspecified symptom associated with female genital organs 08/26/2013   Vaginal Pap smear, abnormal     Past Surgical History:  Procedure Laterality Date   COLONOSCOPY WITH PROPOFOL N/A 04/02/2020    Procedure: COLONOSCOPY WITH PROPOFOL;  Surgeon: Eloise Harman, DO;  Location: [facility];  Service: Endoscopy;  Laterality: N/A;  9:00   DILITATION & CURRETTAGE/HYSTROSCOPY WITH THERMACHOICE ABLATION N/A 10/24/2013   Procedure: DILATATION & CURETTAGE/HYSTEROSCOPY WITH THERMACHOICE ABLATION;  Surgeon: Florian Buff, MD;  Location: [facility];  Service: Gynecology;  Laterality: N/A;  16 ml in  16 ml out temperature 87 degree celcius total therapy time 55mnutes and 11 seconds   POLYPECTOMY N/A 10/24/2013   Procedure: ENDOMETRIAL POLYPECTOMY;  Surgeon: LFlorian Buff MD;  Location: [facility];  Service: Gynecology;  Laterality: N/A;   TUBAL LIGATION      Family History  Problem Relation Age of Onset   Diabetes Mother    Mental illness Mother    Schizophrenia Mother    Dementia Mother    Alzheimer's disease Mother    Diabetes Father    Cancer Father    Diabetes Maternal Grandmother    Diabetes Maternal Grandfather    Diabetes Paternal Grandmother    Diabetes Paternal Grandfather     Social History   Socioeconomic History   Marital status: Divorced    Spouse name: Not on file   Number of children: Not on file   Years of education: Not on file   Highest education level: Not on file  Occupational History   Not on file  Tobacco Use   Smoking status: Never   Smokeless tobacco:  Never  Vaping Use   Vaping Use: Never used  Substance and Sexual Activity   Alcohol use: No   Drug use: No   Sexual activity: Not Currently    Partners: Male    Birth control/protection: Surgical    Comment: tubal and ablation  Other Topics Concern   Not on file  Social History Narrative   Not on file   Social Determinants of Health   Financial Resource Strain: Low Risk  (07/27/2020)   Overall Financial Resource Strain (CARDIA)    Difficulty of Paying Living Expenses: Not hard at all  Food Insecurity: No Food Insecurity (07/27/2020)   Hunger Vital Sign    Worried About Running Out of Food in the Last  Year: Never true    Ran Out of Food in the Last Year: Never true  Transportation Needs: No Transportation Needs (07/27/2020)   PRAPARE - Hydrologist (Medical): No    Lack of Transportation (Non-Medical): No  Physical Activity: Sufficiently Active (07/27/2020)   Exercise Vital Sign    Days of Exercise per Week: 7 days    Minutes of Exercise per Session: 100 min  Stress: Stress Concern Present (07/27/2020)   [place]    Feeling of Stress : Very much  Social Connections: Moderately Integrated (07/27/2020)   Social Connection and Isolation Panel [NHANES]    Frequency of Communication with Friends and Family: More than three times a week    Frequency of Social Gatherings with Friends and Family: More than three times a week    Attends Religious Services: More than 4 times per year    Active Member of [employer] or Organizations: Yes    Attends Archivist Meetings: 1 to 4 times per year    Marital Status: Divorced  Intimate Partner Violence: Not At Risk (07/27/2020)   Humiliation, Afraid, Rape, and Kick questionnaire    Fear of Current or Ex-Partner: No    Emotionally Abused: No    Physically Abused: No    Sexually Abused: No    Outpatient Medications Prior to Visit  Medication Sig Dispense Refill   ibuprofen (ADVIL) 800 MG tablet TAKE 1 TABLET EVERY 8 HOURSAS NEEDED 84 tablet 0   losartan (COZAAR) 25 MG tablet Take 1 tablet (25 mg total) by mouth daily. 90 tablet 1   traZODone (DESYREL) 50 MG tablet Take 0.5-1 tablets (25-50 mg total) by mouth at bedtime as needed for sleep. 30 tablet 3   valACYclovir (VALTREX) 1000 MG tablet Take 1 tablet (1,000 mg total) by mouth daily. 90 tablet 3   traMADol (ULTRAM) 50 MG tablet Take 50 mg by mouth 2 (two) times daily as needed. (Patient not taking: Reported on 08/29/2021)     Cyclobenzaprine HCl (FLEXERIL PO) Take by mouth. (Patient not taking: Reported  on 08/29/2021)     fluticasone (FLONASE) 50 MCG/ACT nasal spray Place 2 sprays into both nostrils daily. (Patient not taking: Reported on 12/29/2021) 18.2 mL 3   No facility-administered medications prior to visit.    Allergies  Allergen Reactions   Methotrexate Other (See Comments) and Nausea And Vomiting   Sulfa Antibiotics Hives and Swelling    Also affected liver.    ROS Review of Systems  Constitutional:  Negative for chills and fever.  HENT:  Positive for congestion, sinus pressure, sinus pain and sore throat.   Eyes:  Negative for pain and discharge.  Respiratory:  Negative for cough  and shortness of breath.   Cardiovascular:  Negative for chest pain and palpitations.  Gastrointestinal:  Negative for abdominal pain, diarrhea, nausea and vomiting.  Endocrine: Negative for polydipsia and polyuria.  Genitourinary:  Negative for dysuria and hematuria.  Musculoskeletal:  Positive for arthralgias, back pain and joint swelling. Negative for neck pain and neck stiffness.  Skin:  Negative for rash.  Neurological:  Negative for dizziness and weakness.  Psychiatric/Behavioral:  Positive for sleep disturbance. Negative for agitation and behavioral problems.       Objective:    Physical Exam Vitals reviewed.  Constitutional:      General: She is not in acute distress.    Appearance: She is not diaphoretic.  HENT:     Head: Normocephalic and atraumatic.     Right Ear: There is no impacted cerumen.     Left Ear: There is no impacted cerumen.     Nose: Congestion present.     Mouth/Throat:     Mouth: Mucous membranes are moist.     Pharynx: No posterior oropharyngeal erythema.  Eyes:     General: No scleral icterus.    Extraocular Movements: Extraocular movements intact.  Cardiovascular:     Rate and Rhythm: Normal rate and regular rhythm.     Pulses: Normal pulses.     Heart sounds: Normal heart sounds. No murmur heard. Pulmonary:     Breath sounds: Normal breath sounds. No  wheezing or rales.  Abdominal:     Palpations: Abdomen is soft.     Tenderness: There is no abdominal tenderness.  Musculoskeletal:     Cervical back: Neck supple. No tenderness.     Right lower leg: No edema.     Left lower leg: No edema.  Skin:    General: Skin is warm.     Findings: No rash.  Neurological:     General: No focal deficit present.     Mental Status: She is alert and oriented to person, place, and time.     Cranial Nerves: No cranial nerve deficit.     Sensory: No sensory deficit.     Motor: No weakness.  Psychiatric:        Mood and Affect: Mood normal.        Behavior: Behavior normal.     BP 128/86 (BP Location: Left Arm, Patient Position: Sitting, Cuff Size: Normal)   Pulse 66   Resp 18   Ht 5' 1.5" (1.562 m)   Wt 163 lb 3.2 oz (74 kg)   SpO2 98%   BMI 30.34 kg/m  Wt Readings from Last 3 Encounters:  12/29/21 163 lb 3.2 oz (74 kg)  08/29/21 150 lb 12.8 oz (68.4 kg)  07/27/20 153 lb 8 oz (69.6 kg)    Lab Results  Component Value Date   TSH 0.756 08/29/2021   Lab Results  Component Value Date   WBC 6.0 08/29/2021   HGB 12.4 08/29/2021   HCT 36.3 08/29/2021   MCV 89 08/29/2021   PLT 250 08/29/2021   Lab Results  Component Value Date   NA 141 08/29/2021   K 3.9 08/29/2021   CO2 25 08/29/2021   GLUCOSE 121 (H) 08/29/2021   BUN 12 08/29/2021   CREATININE 0.83 08/29/2021   BILITOT <0.2 08/29/2021   ALKPHOS 75 08/29/2021   AST 13 08/29/2021   ALT 10 08/29/2021   PROT 7.0 08/29/2021   ALBUMIN 4.7 08/29/2021   CALCIUM 9.3 08/29/2021   ANIONGAP 13 10/21/2013   EGFR  85 08/29/2021   Lab Results  Component Value Date   CHOL 171 08/29/2021   Lab Results  Component Value Date   HDL 41 08/29/2021   Lab Results  Component Value Date   LDLCALC 78 08/29/2021   Lab Results  Component Value Date   TRIG 320 (H) 08/29/2021   Lab Results  Component Value Date   CHOLHDL 4.2 08/29/2021   Lab Results  Component Value Date   HGBA1C 5.4  08/29/2021      Assessment & Plan:   Problem List Items Addressed This Visit       Cardiovascular and Mediastinum   Essential hypertension    BP Readings from Last 1 Encounters:  12/29/21 128/86  Well-controlled with losartan to 25 mg QD Counseled for compliance with the medications Advised DASH diet and moderate exercise/walking, at least 150 mins/week        Other   Insomnia    Sleep hygiene discussed, material provided On trazodone as needed - advised to take it 1 hour before bedtime      Encounter for general adult medical examination with abnormal findings - Primary    Physical exam as documented. Counseling done  re healthy lifestyle involving commitment to 150 minutes exercise per week, heart healthy diet, and attaining healthy weight.The importance of adequate sleep also discussed. Changes in health habits are decided on by the patient with goals and time frames  set for achieving them. Immunization and cancer screening needs are specifically addressed at this visit.      Abnormal mammogram    Reports having abnormal mammogram in the past Needs diagnostic mammogram and [place] of breast, ordered at [facility] imaging      Relevant Orders   MM Digital Diagnostic Bilat   US BREAST LTD UNI LEFT INC AXILLA   US BREAST LTD UNI RIGHT INC AXILLA   Other Visit Diagnoses     Acute non-recurrent frontal sinusitis Symptoms for more than 1 week Started empiric Azithromycin Flonase for allergies     Relevant Medications   azithromycin (ZITHROMAX) 250 MG tablet   fluticasone (FLONASE) 50 MCG/ACT nasal spray   Need for immunization against influenza       Relevant Orders   Flu Vaccine QUAD 75moIM (Fluarix, Fluzone & Alfiuria Quad PF) (Completed)   Need for varicella vaccine       Relevant Orders   Zoster Recombinant (Shingrix ) (Completed)       Meds ordered this encounter  Medications   azithromycin (ZITHROMAX) 250 MG tablet    Sig: Take 2 tablets on day 1, then 1  tablet daily on days 2 through 5    Dispense:  6 tablet    Refill:  0   fluticasone (FLONASE) 50 MCG/ACT nasal spray    Sig: Place 2 sprays into both nostrils daily.    Dispense:  16 g    Refill:  6    Follow-up: Return in about 5 months (around 05/31/2022) for HTN and Shingrix #2.    RLindell Spar MD

## 2021-12-29 NOTE — Assessment & Plan Note (Signed)
Sleep hygiene discussed, material provided On trazodone as needed - advised to take it 1 hour before bedtime

## 2021-12-29 NOTE — Assessment & Plan Note (Signed)
Reports having abnormal mammogram in the past Needs diagnostic mammogram and [place] of breast, ordered at [facility] imaging

## 2021-12-29 NOTE — Assessment & Plan Note (Signed)

## 2021-12-29 NOTE — Patient Instructions (Signed)
Please take Azithromycin as prescribed. Please use Flonase for allergies.  Please take Trazodone an hour before your bedtime.  Please continue to take other medications as prescribed.  Please follow low carb diet and perform moderate exercise/walking at least 150 mins/week.

## 2022-01-11 ENCOUNTER — Other Ambulatory Visit: Payer: Self-pay | Admitting: Internal Medicine

## 2022-01-11 DIAGNOSIS — Z1231 Encounter for screening mammogram for malignant neoplasm of breast: Secondary | ICD-10-CM

## 2022-01-31 ENCOUNTER — Telehealth: Payer: Self-pay

## 2022-01-31 NOTE — Telephone Encounter (Signed)
Order has been in the system since 9/27 can go ahead and schedule for pt

## 2022-01-31 NOTE — Telephone Encounter (Signed)
Mammogram in [facility]

## 2022-01-31 NOTE — Telephone Encounter (Signed)
Patient called asking when will her mammogram be scheduled.

## 2022-01-31 NOTE — Telephone Encounter (Signed)
Patient is asking can she do the mammogram 3D and ultrasound, saying has to have both trying to get it done all at once.

## 2022-02-03 NOTE — Telephone Encounter (Signed)
Called patient unable to reach the patient. Sent [employer]. Thanks

## 2022-02-28 ENCOUNTER — Other Ambulatory Visit: Payer: Self-pay | Admitting: Internal Medicine

## 2022-02-28 DIAGNOSIS — Z1231 Encounter for screening mammogram for malignant neoplasm of breast: Secondary | ICD-10-CM

## 2022-03-20 ENCOUNTER — Ambulatory Visit: Payer: BC Managed Care – PPO | Admitting: Internal Medicine

## 2022-03-24 ENCOUNTER — Telehealth: Payer: Self-pay | Admitting: Internal Medicine

## 2022-03-24 NOTE — Telephone Encounter (Signed)
Patient called in requesting to increase BP meds. Patient cannot get bottom number below 100s   Wants a call back in regard.

## 2022-03-27 NOTE — Telephone Encounter (Signed)
Patient advised.

## 2022-04-09 ENCOUNTER — Other Ambulatory Visit: Payer: Self-pay | Admitting: Internal Medicine

## 2022-04-09 DIAGNOSIS — G4700 Insomnia, unspecified: Secondary | ICD-10-CM

## 2022-04-09 DIAGNOSIS — J0110 Acute frontal sinusitis, unspecified: Secondary | ICD-10-CM

## 2022-04-09 DIAGNOSIS — I10 Essential (primary) hypertension: Secondary | ICD-10-CM

## 2022-05-02 ENCOUNTER — Ambulatory Visit
Admission: RE | Admit: 2022-05-02 | Discharge: 2022-05-02 | Disposition: A | Discharge status: Home / Self Care | Payer: BC Managed Care – PPO | Source: Ambulatory Visit | Attending: Internal Medicine | Admitting: Internal Medicine

## 2022-05-02 DIAGNOSIS — Z1231 Encounter for screening mammogram for malignant neoplasm of breast: Secondary | ICD-10-CM

## 2022-06-01 ENCOUNTER — Ambulatory Visit: Payer: BC Managed Care – PPO | Admitting: Internal Medicine

## 2022-06-01 ENCOUNTER — Encounter: Payer: Self-pay | Admitting: Internal Medicine

## 2022-06-01 VITALS — BP 133/80 | HR 74 | Ht 61.5 in | Wt 168.4 lb

## 2022-06-01 DIAGNOSIS — E669 Obesity, unspecified: Secondary | ICD-10-CM

## 2022-06-01 DIAGNOSIS — I10 Essential (primary) hypertension: Secondary | ICD-10-CM

## 2022-06-01 DIAGNOSIS — M0600 Rheumatoid arthritis without rheumatoid factor, unspecified site: Secondary | ICD-10-CM | POA: Diagnosis not present

## 2022-06-01 DIAGNOSIS — Z23 Encounter for immunization: Secondary | ICD-10-CM | POA: Diagnosis not present

## 2022-06-01 DIAGNOSIS — N951 Menopausal and female climacteric states: Secondary | ICD-10-CM

## 2022-06-01 DIAGNOSIS — J309 Allergic rhinitis, unspecified: Secondary | ICD-10-CM | POA: Diagnosis not present

## 2022-06-01 DIAGNOSIS — R7303 Prediabetes: Secondary | ICD-10-CM

## 2022-06-01 DIAGNOSIS — E782 Mixed hyperlipidemia: Secondary | ICD-10-CM

## 2022-06-01 DIAGNOSIS — R232 Flushing: Secondary | ICD-10-CM

## 2022-06-01 DIAGNOSIS — E559 Vitamin D deficiency, unspecified: Secondary | ICD-10-CM

## 2022-06-01 MED ORDER — LEVOCETIRIZINE DIHYDROCHLORIDE 5 MG PO TABS: 5.0000 mg | ORAL_TABLET | Freq: Every evening | ORAL | 5 refills | Status: DC

## 2022-06-01 MED ORDER — PHENTERMINE HCL 15 MG PO CAPS: 15.0000 mg | ORAL_CAPSULE | ORAL | 2 refills | Status: DC

## 2022-06-01 MED ORDER — VEOZAH 45 MG PO TABS: 1.0000 | ORAL_TABLET | Freq: Every day | ORAL | 5 refills | Status: DC

## 2022-06-01 NOTE — Patient Instructions (Signed)
Please start taking [place] as prescribed.  Please continue taking other medications as prescribed.  Please continue to follow low salt diet and perform moderate exercise/walking at least 150 mins/week.

## 2022-06-01 NOTE — Assessment & Plan Note (Addendum)
Has been on multiple DMARDs in the past Needs to follow up with rheumatology Used to take Tramadol PRN for pain

## 2022-06-01 NOTE — Assessment & Plan Note (Signed)
BP Readings from Last 1 Encounters:  06/01/22 133/80   Well-controlled with losartan to 25 mg QD Counseled for compliance with the medications Advised DASH diet and moderate exercise/walking, at least 150 mins/week

## 2022-06-02 DIAGNOSIS — E669 Obesity, unspecified: Secondary | ICD-10-CM | POA: Insufficient documentation

## 2022-06-02 NOTE — Assessment & Plan Note (Deleted)
Started [employer]

## 2022-06-02 NOTE — Assessment & Plan Note (Signed)
Started Zyrtec Has tried [facility]

## 2022-06-02 NOTE — Progress Notes (Addendum)
 Established Patient Office Visit  Subjective:  Patient ID: Becky Henry, female    DOB: Dec 26, 1970  Age: 52 y.o. MRN: [id number]  CC:  Chief Complaint  Patient presents with   Hypertension    Five month follow up    HPI Becky Henry is a 52 y.o. female with past medical history of HTN, seronegative RA, OA and insomnia who presents for f/u of her chronic medical conditions.  HTN: Her BP is well controlled currently. She takes Losartan  25 mg QD now.  She reports that her blood pressure rises sometimes when she is stressed.  She denies any headache, dizziness, chest pain, dyspnea or palpitations currently.  Seronegative RA: She used to follow up with Rheumatology.  She was on Arava, but has stopped taking it.  She was placed on Mobic and Flexeril  as needed for OA as well, but has not been taking it.  She prefers to take Tylenol  as needed for moderate to severe pain.  She reports insomnia, but denies any anhedonia, anxiety, SI or HI currently. She has been doing better with Trazodone  now.  She also reports chronic nasal congestion, pain around the nasal bridge and sinus pressure headache. She has tried Flonase  with some relief. Denies any fever, chills, dyspnea or wheezing.  She complains of chronic hot flashes. She has almost daily episodes. She denies any vaginal dryness or itching currently.  She feels heat sensitivity.  Denies recent weight loss or palpitations.  Obesity: She has been trying to follow strict low-carb diet and has tried exercise regimen as well.  She is still unable to lose weight.  I had lengthy discussion about medical weight loss options, she prefers to start phentermine  for now.  Past Medical History:  Diagnosis Date   Abnormal Pap smear    Abnormal uterine bleeding (AUB) 09/24/2015   Anemia    Elevated triglycerides with high cholesterol 01/07/2018   Endometrial polyp 08/26/2013   Herpes simplex without mention of complication    Hypertension    Insomnia  09/25/2013   Labial lesion 01/28/2013   Left ovarian cyst 08/26/2013   LLQ pain 09/24/2015   PMS (premenstrual syndrome) 11/03/2014   Rheumatoid arthritis (HCC)    Stress at home 11/03/2014   Unspecified symptom associated with female genital organs 08/26/2013   Vaginal Pap smear, abnormal     Past Surgical History:  Procedure Laterality Date   COLONOSCOPY WITH PROPOFOL  N/A 04/02/2020   Procedure: COLONOSCOPY WITH PROPOFOL ;  Surgeon: Cindie Carlin POUR, DO;  Location: [facility];  Service: Endoscopy;  Laterality: N/A;  9:00   DILITATION & CURRETTAGE/HYSTROSCOPY WITH THERMACHOICE ABLATION N/A 10/24/2013   Procedure: DILATATION & CURETTAGE/HYSTEROSCOPY WITH THERMACHOICE ABLATION;  Surgeon: Vonn VEAR Inch, MD;  Location: [facility];  Service: Gynecology;  Laterality: N/A;  16 ml in  16 ml out temperature 87 degree celcius total therapy time and 11 seconds   POLYPECTOMY N/A 10/24/2013   Procedure: ENDOMETRIAL POLYPECTOMY;  Surgeon: Vonn VEAR Inch, MD;  Location: [facility];  Service: Gynecology;  Laterality: N/A;   TUBAL LIGATION      Family History  Problem Relation Age of Onset   Diabetes Mother    Mental illness Mother    Schizophrenia Mother    Dementia Mother    Alzheimer's disease Mother    Diabetes Father    Cancer Father    Diabetes Maternal Grandmother    Diabetes Maternal Grandfather    Diabetes Paternal Grandmother    Diabetes Paternal Grandfather  Social History   Socioeconomic History   Marital status: Divorced    Spouse name: Not on file   Number of children: Not on file   Years of education: Not on file   Highest education level: Not on file  Occupational History   Not on file  Tobacco Use   Smoking status: Never   Smokeless tobacco: Never  Vaping Use   Vaping Use: Never used  Substance and Sexual Activity   Alcohol use: No   Drug use: No   Sexual activity: Not Currently    Partners: Male    Birth control/protection: Surgical    Comment: tubal and  ablation  Other Topics Concern   Not on file  Social History Narrative   Not on file   Social Determinants of Health   Financial Resource Strain: Low Risk  (07/27/2020)   Overall Financial Resource Strain (CARDIA)    Difficulty of Paying Living Expenses: Not hard at all  Food Insecurity: No Food Insecurity (07/27/2020)   Hunger Vital Sign    Worried About Running Out of Food in the Last Year: Never true    Ran Out of Food in the Last Year: Never true  Transportation Needs: No Transportation Needs (07/27/2020)   PRAPARE - Administrator, Civil Service (Medical): No    Lack of Transportation (Non-Medical): No  Physical Activity: Sufficiently Active (07/27/2020)   Exercise Vital Sign    Days of Exercise per Week: 7 days    Minutes of Exercise per Session: 100 min  Stress: Stress Concern Present (07/27/2020)   [employer] of Occupational Health - Occupational Stress Questionnaire    Feeling of Stress : Very much  Social Connections: Moderately Integrated (07/27/2020)   Social Connection and Isolation Panel [NHANES]    Frequency of Communication with Friends and Family: More than three times a week    Frequency of Social Gatherings with Friends and Family: More than three times a week    Attends Religious Services: More than 4 times per year    Active Member of [employer] or Organizations: Yes    Attends Banker Meetings: 1 to 4 times per year    Marital Status: Divorced  Intimate Partner Violence: Not At Risk (07/27/2020)   Humiliation, Afraid, Rape, and Kick questionnaire    Fear of Current or Ex-Partner: No    Emotionally Abused: No    Physically Abused: No    Sexually Abused: No    ROS Review of Systems  Constitutional:  Negative for chills and fever.  HENT:  Positive for congestion and sinus pressure. Negative for sinus pain and sore throat.   Eyes:  Negative for pain and discharge.  Respiratory:  Negative for cough and shortness of breath.    Cardiovascular:  Negative for chest pain and palpitations.  Gastrointestinal:  Negative for abdominal pain, diarrhea, nausea and vomiting.  Endocrine: Negative for polydipsia and polyuria.  Genitourinary:  Negative for dysuria and hematuria.  Musculoskeletal:  Positive for arthralgias, back pain and joint swelling. Negative for neck pain and neck stiffness.  Skin:  Negative for rash.  Neurological:  Negative for dizziness and weakness.  Psychiatric/Behavioral:  Positive for sleep disturbance. Negative for agitation and behavioral problems.     Objective:   Today's Vitals: BP 133/80 (BP Location: Left Arm, Patient Position: Sitting, Cuff Size: Normal)   Pulse 74   Ht 5' 1.5 (1.562 m)   Wt 168 lb 6.4 oz (76.4 kg)   SpO2 98%  BMI 31.30 kg/m   Physical Exam Vitals reviewed.  Constitutional:      General: She is not in acute distress.    Appearance: She is obese. She is not diaphoretic.  HENT:     Head: Normocephalic and atraumatic.     Nose: Congestion present.     Mouth/Throat:     Mouth: Mucous membranes are moist.  Eyes:     General: No scleral icterus.    Extraocular Movements: Extraocular movements intact.  Cardiovascular:     Rate and Rhythm: Normal rate and regular rhythm.     Pulses: Normal pulses.     Heart sounds: Normal heart sounds. No murmur heard. Pulmonary:     Breath sounds: Normal breath sounds. No wheezing or rales.  Musculoskeletal:     Cervical back: Neck supple. No tenderness.     Right lower leg: No edema.     Left lower leg: No edema.  Skin:    General: Skin is warm.     Findings: No rash.  Neurological:     General: No focal deficit present.     Mental Status: She is alert and oriented to person, place, and time.  Psychiatric:        Mood and Affect: Mood normal.        Behavior: Behavior normal.     Assessment & Plan:   Problem List Items Addressed This Visit       Cardiovascular and Mediastinum   Essential hypertension - Primary     BP Readings from Last 1 Encounters:  06/01/22 133/80  Well-controlled with losartan  to 25 mg QD Counseled for compliance with the medications Advised DASH diet and moderate exercise/walking, at least 150 mins/week      Relevant Orders   TSH   CMP14+EGFR     Respiratory   Allergic sinusitis    Started Zyrtec Has tried Flonase       Relevant Medications   levocetirizine (XYZAL ) 5 MG tablet   Other Relevant Orders   CBC with Differential/Platelet     Musculoskeletal and Integument   Seronegative rheumatoid arthritis (HCC)    Has been on multiple DMARDs in the past Needs to follow up with rheumatology Used to take Tramadol PRN for pain      Relevant Orders   TSH   CMP14+EGFR   CBC with Differential/Platelet     Other   Vasomotor symptoms due to menopause    Has hot flashes chronically Prefer to avoid hormonal treatment due to h/o uterine bleeding Started Veozah       Relevant Medications   Fezolinetant  (VEOZAH ) 45 MG TABS   Class 1 obesity without serious comorbidity in adult    BMI Readings from Last 3 Encounters:  06/01/22 31.30 kg/m  12/29/21 30.34 kg/m  08/29/21 28.03 kg/m  Has been trying to follow low carb diet and has tried following weight loss program Had discussion about medical weight loss options Started Phentermine  15 mg QD for now Advised to continue low carb diet and perform moderate exercise/walking at least 150 mins/week      Relevant Medications   phentermine  15 MG capsule   Other Visit Diagnoses     Need for zoster vaccination       Relevant Orders   Zoster Recombinant (Shingrix  ) (Completed)   Prediabetes       Relevant Orders   Hemoglobin A1c   Mixed hyperlipidemia       Relevant Orders   Lipid panel   Vitamin D  deficiency  Relevant Orders   VITAMIN D  25 Hydroxy (Vit-D Deficiency, Fractures)   Hot flashes           Outpatient Encounter Medications as of 06/01/2022  Medication Sig   Fezolinetant  (VEOZAH ) 45 MG TABS  Take 1 tablet (45 mg total) by mouth daily.   levocetirizine (XYZAL ) 5 MG tablet Take 1 tablet (5 mg total) by mouth every evening.   phentermine  15 MG capsule Take 1 capsule (15 mg total) by mouth every morning.   fluticasone  (FLONASE ) 50 MCG/ACT nasal spray Place 2 sprays into both nostrils daily.   ibuprofen  (ADVIL ) 800 MG tablet TAKE 1 TABLET EVERY 8 HOURSAS NEEDED   losartan  (COZAAR ) 25 MG tablet TAKE 1 TABLET DAILY   traMADol (ULTRAM) 50 MG tablet Take 50 mg by mouth 2 (two) times daily as needed. (Patient not taking: Reported on 08/29/2021)   traZODone  (DESYREL ) 50 MG tablet TAKE 1/2 TO 1 TABLET  AT   BEDTIME AS     NEEDED FOR  SLEEP   valACYclovir  (VALTREX ) 1000 MG tablet Take 1 tablet (1,000 mg total) by mouth daily.   No facility-administered encounter medications on file as of 06/01/2022.    Follow-up: Return in about 3 months (around 08/30/2022) for HTN and hot flashes.   Suzzane MARLA Blanch, MD

## 2022-06-02 NOTE — Assessment & Plan Note (Signed)
Has hot flashes chronically Prefer to avoid hormonal treatment due to h/o uterine bleeding Started Willette Pa

## 2022-06-02 NOTE — Assessment & Plan Note (Signed)
BMI Readings from Last 3 Encounters:  06/01/22 31.30 kg/m  12/29/21 30.34 kg/m  08/29/21 28.03 kg/m   Has been trying to follow low carb diet and has tried following weight loss program Had discussion about medical weight loss options Started Phentermine 15 mg QD for now Advised to continue low carb diet and perform moderate exercise/walking at least 150 mins/week

## 2022-06-04 ENCOUNTER — Other Ambulatory Visit: Payer: Self-pay | Admitting: Internal Medicine

## 2022-06-04 DIAGNOSIS — J309 Allergic rhinitis, unspecified: Secondary | ICD-10-CM

## 2022-06-07 ENCOUNTER — Other Ambulatory Visit: Payer: Self-pay | Admitting: Internal Medicine

## 2022-06-07 ENCOUNTER — Telehealth: Payer: Self-pay | Admitting: Internal Medicine

## 2022-06-07 DIAGNOSIS — E669 Obesity, unspecified: Secondary | ICD-10-CM

## 2022-06-07 MED ORDER — PHENTERMINE HCL 15 MG PO CAPS: 15.0000 mg | ORAL_CAPSULE | ORAL | 2 refills | Status: DC

## 2022-06-07 NOTE — Telephone Encounter (Signed)
Patient called said her pharmacy [facility] does not cover this medication  phentermine 15 MG capsule [id number]   Only will do 30 day supply for insurance to cover.  [address], [place] [address] [phone]) [phone]

## 2022-08-28 ENCOUNTER — Other Ambulatory Visit: Payer: Self-pay | Admitting: Internal Medicine

## 2022-08-28 DIAGNOSIS — E669 Obesity, unspecified: Secondary | ICD-10-CM

## 2022-08-31 ENCOUNTER — Ambulatory Visit: Payer: BC Managed Care – PPO | Admitting: Internal Medicine

## 2022-09-06 ENCOUNTER — Other Ambulatory Visit: Payer: Self-pay | Admitting: Internal Medicine

## 2022-09-06 DIAGNOSIS — I10 Essential (primary) hypertension: Secondary | ICD-10-CM

## 2022-09-06 DIAGNOSIS — G4700 Insomnia, unspecified: Secondary | ICD-10-CM

## 2022-10-07 ENCOUNTER — Other Ambulatory Visit: Payer: Self-pay | Admitting: Internal Medicine

## 2022-10-07 DIAGNOSIS — E669 Obesity, unspecified: Secondary | ICD-10-CM

## 2022-12-27 ENCOUNTER — Other Ambulatory Visit: Payer: Self-pay | Admitting: Internal Medicine

## 2022-12-27 DIAGNOSIS — I10 Essential (primary) hypertension: Secondary | ICD-10-CM

## 2022-12-27 DIAGNOSIS — G4700 Insomnia, unspecified: Secondary | ICD-10-CM

## 2023-02-13 ENCOUNTER — Other Ambulatory Visit: Payer: Self-pay | Admitting: Internal Medicine

## 2023-02-13 DIAGNOSIS — J309 Allergic rhinitis, unspecified: Secondary | ICD-10-CM

## 2023-07-02 ENCOUNTER — Other Ambulatory Visit: Payer: Self-pay | Admitting: Internal Medicine

## 2023-07-02 DIAGNOSIS — G4700 Insomnia, unspecified: Secondary | ICD-10-CM

## 2023-07-15 ENCOUNTER — Other Ambulatory Visit: Payer: Self-pay | Admitting: Internal Medicine

## 2023-07-15 DIAGNOSIS — I10 Essential (primary) hypertension: Secondary | ICD-10-CM

## 2023-12-03 ENCOUNTER — Other Ambulatory Visit: Payer: Self-pay | Admitting: Internal Medicine

## 2023-12-03 DIAGNOSIS — I10 Essential (primary) hypertension: Secondary | ICD-10-CM

## 2023-12-03 DIAGNOSIS — G4700 Insomnia, unspecified: Secondary | ICD-10-CM

## 2024-02-12 ENCOUNTER — Ambulatory Visit: Payer: Self-pay

## 2024-02-12 NOTE — Telephone Encounter (Signed)
 FYI Only or Action Required?: FYI only for provider.  Patient was last seen in primary care on 06/01/2022 by Tobie Suzzane POUR, MD.  Called Nurse Triage reporting Hypertension.  Symptoms began today.  Interventions attempted: Prescription medications: Cozaar .  Symptoms are: gradually worsening.  Triage Disposition: See HCP Within 4 Hours (Or PCP Triage)  Patient/caregiver understands and will follow disposition?: Yes  Copied from CRM [id number]. Topic: Clinical - Red Word Triage >> Feb 12, 2024 10:45 AM [place] MATSU wrote: Red Word that prompted transfer to Nurse Triage: high blood pressure 202/105 heart rate 131  not feeling well.  Reason for Disposition  [1] Systolic BP >= 200 OR Diastolic >= 120 AND [2] having NO cardiac or neurologic symptoms  Answer Assessment - Initial Assessment Questions Pt reports last taking her cozaar  last night and has not missed any doses, does not routinely checking BP unless she's not feeling well (difficulty thinking). Pt unsure what her BP normally runs. Pt reports she is driving and is almost home. Advised to take her cozaar  and go to ED, and call office back to schedule f/u appt with PCP when leaving the hospital. Advised to call 911 if pt develops worsening symptoms.  1. BLOOD PRESSURE: What is your blood pressure? Did you take at least two measurements 5 minutes apart?     BP 202/105, HR 131 about an hour ago  2. ONSET: When did you take your blood pressure?     1 hour ago  3. HOW: How did you take your blood pressure? (e.g., automatic home BP monitor, visiting nurse)     At work  4. HISTORY: Do you have a history of high blood pressure?     Yes.   5. MEDICINES: Are you taking any medicines for blood pressure? Have you missed any doses recently?     Cozaar  25 mg  6. OTHER SYMPTOMS: Do you have any symptoms? (e.g., blurred vision, chest pain, difficulty breathing, headache, weakness)     Headache, not feeling like self. Swelling  in left leg starting a few weeks. Denies CP or SOB.  Protocols used: Blood Pressure - High-A-AH

## 2024-02-12 NOTE — Telephone Encounter (Signed)
Noted,  ED

## 2024-02-18 ENCOUNTER — Other Ambulatory Visit: Payer: Self-pay | Admitting: Internal Medicine

## 2024-02-18 ENCOUNTER — Encounter: Payer: Self-pay | Admitting: Internal Medicine

## 2024-02-18 ENCOUNTER — Ambulatory Visit (INDEPENDENT_AMBULATORY_CARE_PROVIDER_SITE_OTHER): Payer: Self-pay | Admitting: Internal Medicine

## 2024-02-18 VITALS — BP 122/83 | HR 71 | Ht 60.0 in | Wt 164.8 lb

## 2024-02-18 DIAGNOSIS — E782 Mixed hyperlipidemia: Secondary | ICD-10-CM

## 2024-02-18 DIAGNOSIS — I10 Essential (primary) hypertension: Secondary | ICD-10-CM

## 2024-02-18 DIAGNOSIS — Z0001 Encounter for general adult medical examination with abnormal findings: Secondary | ICD-10-CM

## 2024-02-18 DIAGNOSIS — Z1231 Encounter for screening mammogram for malignant neoplasm of breast: Secondary | ICD-10-CM

## 2024-02-18 DIAGNOSIS — Z23 Encounter for immunization: Secondary | ICD-10-CM | POA: Diagnosis not present

## 2024-02-18 DIAGNOSIS — E559 Vitamin D deficiency, unspecified: Secondary | ICD-10-CM

## 2024-02-18 DIAGNOSIS — G4700 Insomnia, unspecified: Secondary | ICD-10-CM | POA: Diagnosis not present

## 2024-02-18 DIAGNOSIS — N951 Menopausal and female climacteric states: Secondary | ICD-10-CM

## 2024-02-18 DIAGNOSIS — M0600 Rheumatoid arthritis without rheumatoid factor, unspecified site: Secondary | ICD-10-CM

## 2024-02-18 DIAGNOSIS — R739 Hyperglycemia, unspecified: Secondary | ICD-10-CM

## 2024-02-18 MED ORDER — TRAZODONE HCL 50 MG PO TABS
75.0000 mg | ORAL_TABLET | Freq: Every evening | ORAL | 1 refills | Status: AC | PRN
Start: 2024-02-18 — End: ?

## 2024-02-18 MED ORDER — VEOZAH 45 MG PO TABS
1.0000 | ORAL_TABLET | Freq: Every day | ORAL | 5 refills | Status: AC
Start: 2024-02-18 — End: ?

## 2024-02-18 NOTE — Assessment & Plan Note (Addendum)
 Sleep hygiene discussed, material provided Takes trazodone  as needed - advised to take it 1 hour before bedtime Increase dose of trazodone  to 75 mg nightly

## 2024-02-18 NOTE — Assessment & Plan Note (Addendum)
 Has hot flashes chronically Would avoid hormonal treatment due to h/o uterine bleeding Unable to add Paxil as she is already on Trazodone  Started Veozah  for hot flashes - coupon provided

## 2024-02-18 NOTE — Progress Notes (Signed)
 Established Patient Office Visit  Subjective:  Patient ID: Becky Henry, female    DOB: 04/08/1971  Age: 53 y.o. MRN: [id number]  CC:  Chief Complaint  Patient presents with   Annual Exam    [name]     HPI Becky Henry is a 53 y.o. female with past medical history of HTN, seronegative RA, OA and insomnia who presents for annual physical.  She was last seen in 02/24.  HTN: Her BP is well controlled currently. She takes Losartan  25 mg QD now.  She reports that her blood pressure rises sometimes when she is stressed. She had to miss work from 02/13/24 till today due to elevated BP.  She went to Mobile care unit, where her BP was 200s/100s and was told to rest. She denies any headache, dizziness, chest pain, dyspnea or palpitations currently.  Seronegative RA: She used to follow up with Rheumatology.  She was on Arava, but has stopped taking it.  She was placed on Mobic and Flexeril  as needed for OA as well, but has not been taking it.  She prefers to take Tylenol  as needed for moderate to severe pain.  She reports insomnia, still has difficulty initiating sleep. She denies any anhedonia, anxiety, SI or HI currently. She takes Trazodone  50 mg qHS.  She also reports chronic nasal congestion, pain around the nasal bridge and sinus pressure headache. She has tried Flonase  with some relief. Denies any fever, chills, dyspnea or wheezing.   She complains of chronic hot flashes. She has almost daily episodes. She denies any vaginal dryness or itching currently.  She feels heat sensitivity.  Denies recent weight loss or palpitations.  She was given Veozah  in the last visit, but was not able to get it due to cost.     Past Medical History:  Diagnosis Date   Abnormal Pap smear    Abnormal uterine bleeding (AUB) 09/24/2015   Anemia    Elevated triglycerides with high cholesterol 01/07/2018   Endometrial polyp 08/26/2013   Herpes simplex without mention of complication    Hypertension    Insomnia  09/25/2013   Labial lesion 01/28/2013   Left ovarian cyst 08/26/2013   LLQ pain 09/24/2015   PMS (premenstrual syndrome) 11/03/2014   Rheumatoid arthritis (HCC)    Stress at home 11/03/2014   Unspecified symptom associated with female genital organs 08/26/2013   Vaginal Pap smear, abnormal     Past Surgical History:  Procedure Laterality Date   COLONOSCOPY WITH PROPOFOL  N/A 04/02/2020   Procedure: COLONOSCOPY WITH PROPOFOL ;  Surgeon: Cindie Carlin POUR, DO;  Location: [facility];  Service: Endoscopy;  Laterality: N/A;  9:00   DILITATION & CURRETTAGE/HYSTROSCOPY WITH THERMACHOICE ABLATION N/A 10/24/2013   Procedure: DILATATION & CURETTAGE/HYSTEROSCOPY WITH THERMACHOICE ABLATION;  Surgeon: Vonn VEAR Inch, MD;  Location: [facility];  Service: Gynecology;  Laterality: N/A;  16 ml in  16 ml out temperature 87 degree celcius total therapy time and 11 seconds   POLYPECTOMY N/A 10/24/2013   Procedure: ENDOMETRIAL POLYPECTOMY;  Surgeon: Vonn VEAR Inch, MD;  Location: [facility];  Service: Gynecology;  Laterality: N/A;   TUBAL LIGATION      Family History  Problem Relation Age of Onset   Diabetes Mother    Mental illness Mother    Schizophrenia Mother    Dementia Mother    Alzheimer's disease Mother    Diabetes Father    Cancer Father    Diabetes Maternal Grandmother    Diabetes  Maternal Grandfather    Diabetes Paternal Grandmother    Diabetes Paternal Grandfather     Social History   Socioeconomic History   Marital status: Divorced    Spouse name: Not on file   Number of children: Not on file   Years of education: Not on file   Highest education level: Not on file  Occupational History   Not on file  Tobacco Use   Smoking status: Never   Smokeless tobacco: Never  Vaping Use   Vaping status: Never Used  Substance and Sexual Activity   Alcohol use: No   Drug use: No   Sexual activity: Not Currently    Partners: Male    Birth control/protection: Surgical    Comment: tubal and  ablation  Other Topics Concern   Not on file  Social History Narrative   Not on file   Social Drivers of Health   Financial Resource Strain: Low Risk  (07/27/2020)   Overall Financial Resource Strain (CARDIA)    Difficulty of Paying Living Expenses: Not hard at all  Food Insecurity: No Food Insecurity (07/27/2020)   Hunger Vital Sign    Worried About Running Out of Food in the Last Year: Never true    Ran Out of Food in the Last Year: Never true  Transportation Needs: No Transportation Needs (07/27/2020)   PRAPARE - Administrator, Civil Service (Medical): No    Lack of Transportation (Non-Medical): No  Physical Activity: Sufficiently Active (07/27/2020)   Exercise Vital Sign    Days of Exercise per Week: 7 days    Minutes of Exercise per Session: 100 min  Stress: Stress Concern Present (07/27/2020)   [employer] of Occupational Health - Occupational Stress Questionnaire    Feeling of Stress : Very much  Social Connections: Moderately Integrated (07/27/2020)   Social Connection and Isolation Panel    Frequency of Communication with Friends and Family: More than three times a week    Frequency of Social Gatherings with Friends and Family: More than three times a week    Attends Religious Services: More than 4 times per year    Active Member of [employer] or Organizations: Yes    Attends Banker Meetings: 1 to 4 times per year    Marital Status: Divorced  Intimate Partner Violence: Not At Risk (07/27/2020)   Humiliation, Afraid, Rape, and Kick questionnaire    Fear of Current or Ex-Partner: No    Emotionally Abused: No    Physically Abused: No    Sexually Abused: No    Outpatient Medications Prior to Visit  Medication Sig Dispense Refill   desloratadine (CLARINEX) 5 MG tablet TAKE 1 TABLET DAILY 30 tablet 5   fluticasone  (FLONASE ) 50 MCG/ACT nasal spray Place 2 sprays into both nostrils daily. 16 g 6   ibuprofen  (ADVIL ) 800 MG tablet TAKE 1 TABLET EVERY 8  HOURSAS NEEDED 84 tablet 0   losartan  (COZAAR ) 25 MG tablet TAKE 1 TABLET DAILY 90 tablet 1   Fezolinetant  (VEOZAH ) 45 MG TABS Take 1 tablet (45 mg total) by mouth daily. 30 tablet 5   traZODone  (DESYREL ) 50 MG tablet TAKE 1/2 TO 1 TABLET AT    BEDTIME AS NEEDED FOR SLEEP 90 tablet 1   phentermine  15 MG capsule Take 1 capsule (15 mg total) by mouth every morning. 30 capsule 2   traMADol (ULTRAM) 50 MG tablet Take 50 mg by mouth 2 (two) times daily as needed. (Patient not taking: Reported  on 08/29/2021)     valACYclovir  (VALTREX ) 1000 MG tablet Take 1 tablet (1,000 mg total) by mouth daily. 90 tablet 3   No facility-administered medications prior to visit.    Allergies  Allergen Reactions   Methotrexate Other (See Comments) and Nausea And Vomiting   Sulfa Antibiotics Hives and Swelling    Also affected liver.    ROS Review of Systems  Constitutional:  Negative for chills and fever.  HENT:  Positive for congestion and sinus pressure. Negative for sinus pain and sore throat.   Eyes:  Negative for pain and discharge.  Respiratory:  Negative for cough and shortness of breath.   Cardiovascular:  Negative for chest pain and palpitations.  Gastrointestinal:  Negative for abdominal pain, diarrhea, nausea and vomiting.  Endocrine: Negative for polydipsia and polyuria.  Genitourinary:  Negative for dysuria and hematuria.  Musculoskeletal:  Positive for arthralgias, back pain and joint swelling. Negative for neck pain and neck stiffness.  Skin:  Negative for rash.  Neurological:  Negative for dizziness and weakness.  Psychiatric/Behavioral:  Positive for sleep disturbance. Negative for agitation and behavioral problems.       Objective:    Physical Exam Vitals reviewed.  Constitutional:      General: She is not in acute distress.    Appearance: She is obese. She is not diaphoretic.  HENT:     Head: Normocephalic and atraumatic.     Nose: Congestion present.     Mouth/Throat:      Mouth: Mucous membranes are moist.  Eyes:     General: No scleral icterus.    Extraocular Movements: Extraocular movements intact.  Cardiovascular:     Rate and Rhythm: Normal rate and regular rhythm.     Heart sounds: Normal heart sounds. No murmur heard. Pulmonary:     Breath sounds: Normal breath sounds. No wheezing or rales.  Abdominal:     Palpations: Abdomen is soft.     Tenderness: There is no abdominal tenderness.  Musculoskeletal:     Cervical back: Neck supple. No tenderness.     Right lower leg: No edema.     Left lower leg: No edema.  Skin:    General: Skin is warm.     Findings: No rash.  Neurological:     General: No focal deficit present.     Mental Status: She is alert and oriented to person, place, and time.     Cranial Nerves: No cranial nerve deficit.     Sensory: No sensory deficit.     Motor: No weakness.  Psychiatric:        Mood and Affect: Mood normal.        Behavior: Behavior normal.     BP 122/83   Pulse 71   Ht 5' (1.524 m)   Wt 164 lb 12.8 oz (74.8 kg)   SpO2 99%   BMI 32.19 kg/m  Wt Readings from Last 3 Encounters:  02/18/24 164 lb 12.8 oz (74.8 kg)  06/01/22 168 lb 6.4 oz (76.4 kg)  12/29/21 163 lb 3.2 oz (74 kg)    Lab Results  Component Value Date   TSH 0.756 08/29/2021   Lab Results  Component Value Date   WBC 6.0 08/29/2021   HGB 12.4 08/29/2021   HCT 36.3 08/29/2021   MCV 89 08/29/2021   PLT 250 08/29/2021   Lab Results  Component Value Date   NA 141 08/29/2021   K 3.9 08/29/2021   CO2 25 08/29/2021  GLUCOSE 121 (H) 08/29/2021   BUN 12 08/29/2021   CREATININE 0.83 08/29/2021   BILITOT <0.2 08/29/2021   ALKPHOS 75 08/29/2021   AST 13 08/29/2021   ALT 10 08/29/2021   PROT 7.0 08/29/2021   ALBUMIN 4.7 08/29/2021   CALCIUM 9.3 08/29/2021   ANIONGAP 13 10/21/2013   EGFR 85 08/29/2021   Lab Results  Component Value Date   CHOL 171 08/29/2021   Lab Results  Component Value Date   HDL 41 08/29/2021   Lab  Results  Component Value Date   LDLCALC 78 08/29/2021   Lab Results  Component Value Date   TRIG 320 (H) 08/29/2021   Lab Results  Component Value Date   CHOLHDL 4.2 08/29/2021   Lab Results  Component Value Date   HGBA1C 5.4 08/29/2021      Assessment & Plan:   Problem List Items Addressed This Visit       Cardiovascular and Mediastinum   Essential hypertension   BP Readings from Last 1 Encounters:  02/18/24 122/83   Well-controlled with losartan  25 mg QD Counseled for compliance with the medications Advised DASH diet and moderate exercise/walking, at least 150 mins/week  Has fluctuation in blood pressure with stress at work      Relevant Orders   TSH   CMP14+EGFR   CBC with Differential/Platelet     Musculoskeletal and Integument   Seronegative rheumatoid arthritis (HCC)   Has been on multiple DMARDs in the past Needs to follow up with rheumatology Used to take Tramadol PRN for pain Advised to avoid more than 15 lbs weight - work note provided        Other   Insomnia   Sleep hygiene discussed, material provided Takes trazodone  as needed - advised to take it 1 hour before bedtime Increase dose of trazodone  to 75 mg nightly      Relevant Medications   traZODone  (DESYREL ) 50 MG tablet   Encounter for general adult medical examination with abnormal findings - Primary   Physical exam as documented. Counseling done  re healthy lifestyle involving commitment to 150 minutes exercise per week, heart healthy diet, and attaining healthy weight.The importance of adequate sleep also discussed. Immunization and cancer screening needs are specifically addressed at this visit.      Vasomotor symptoms due to menopause   Has hot flashes chronically Would avoid hormonal treatment due to h/o uterine bleeding Unable to add Paxil as she is already on Trazodone  Started Veozah  for hot flashes - coupon provided      Relevant Medications   Fezolinetant  (VEOZAH ) 45 MG  TABS   Other Visit Diagnoses       Encounter for screening mammogram for malignant neoplasm of breast         Vitamin D  deficiency       Relevant Orders   VITAMIN D  25 Hydroxy (Vit-D Deficiency, Fractures)     Mixed hyperlipidemia       Relevant Orders   Lipid panel     Hyperglycemia       Relevant Orders   Hemoglobin A1c     Encounter for immunization       Relevant Orders   Tdap vaccine greater than or equal to 7yo IM (Completed)       Meds ordered this encounter  Medications   Fezolinetant  (VEOZAH ) 45 MG TABS    Sig: Take 1 tablet (45 mg total) by mouth daily.    Dispense:  30 tablet    Refill:  5   traZODone  (DESYREL ) 50 MG tablet    Sig: Take 1.5 tablets (75 mg total) by mouth at bedtime as needed for sleep.    Dispense:  135 tablet    Refill:  1    Follow-up: Return in about 6 months (around 08/17/2024).    Suzzane MARLA Blanch, MD

## 2024-02-18 NOTE — Assessment & Plan Note (Addendum)
 Has been on multiple DMARDs in the past Needs to follow up with rheumatology Used to take Tramadol PRN for pain Advised to avoid more than 15 lbs weight - work note provided

## 2024-02-18 NOTE — Assessment & Plan Note (Signed)
 Physical exam as documented. Counseling done  re healthy lifestyle involving commitment to 150 minutes exercise per week, heart healthy diet, and attaining healthy weight.The importance of adequate sleep also discussed. Immunization and cancer screening needs are specifically addressed at this visit.

## 2024-02-18 NOTE — Assessment & Plan Note (Addendum)
 BP Readings from Last 1 Encounters:  02/18/24 122/83   Well-controlled with losartan  25 mg QD Counseled for compliance with the medications Advised DASH diet and moderate exercise/walking, at least 150 mins/week  Has fluctuation in blood pressure with stress at work

## 2024-02-18 NOTE — Patient Instructions (Addendum)
 Please schedule Mammography.  Please start taking Trazodone  1.5 tablets for insomnia.  Please continue to take medications as prescribed.  Please continue to follow low salt diet and perform moderate exercise/walking at least 150 mins/week.

## 2024-02-19 ENCOUNTER — Ambulatory Visit: Payer: Self-pay | Admitting: Internal Medicine

## 2024-02-19 LAB — CBC WITH DIFFERENTIAL/PLATELET
Basophils Absolute: 0.1 x10E3/uL (ref 0.0–0.2)
Basos: 1 %
EOS (ABSOLUTE): 0.3 x10E3/uL (ref 0.0–0.4)
Eos: 4 %
Hematocrit: 41.4 % (ref 34.0–46.6)
Hemoglobin: 13.3 g/dL (ref 11.1–15.9)
Immature Grans (Abs): 0 x10E3/uL (ref 0.0–0.1)
Immature Granulocytes: 0 %
Lymphocytes Absolute: 1.7 x10E3/uL (ref 0.7–3.1)
Lymphs: 24 %
MCH: 29.1 pg (ref 26.6–33.0)
MCHC: 32.1 g/dL (ref 31.5–35.7)
MCV: 91 fL (ref 79–97)
Monocytes Absolute: 0.5 x10E3/uL (ref 0.1–0.9)
Monocytes: 7 %
Neutrophils Absolute: 4.4 x10E3/uL (ref 1.4–7.0)
Neutrophils: 63 %
Platelets: 265 x10E3/uL (ref 150–450)
RBC: 4.57 x10E6/uL (ref 3.77–5.28)
RDW: 14.6 % (ref 11.7–15.4)
WBC: 7 x10E3/uL (ref 3.4–10.8)

## 2024-02-19 LAB — CMP14+EGFR
ALT: 15 IU/L (ref 0–32)
AST: 13 IU/L (ref 0–40)
Albumin: 4.9 g/dL (ref 3.8–4.9)
Alkaline Phosphatase: 87 IU/L (ref 49–135)
BUN/Creatinine Ratio: 10 (ref 9–23)
BUN: 8 mg/dL (ref 6–24)
Bilirubin Total: 0.4 mg/dL (ref 0.0–1.2)
CO2: 22 mmol/L (ref 20–29)
Calcium: 10 mg/dL (ref 8.7–10.2)
Chloride: 101 mmol/L (ref 96–106)
Creatinine, Ser: 0.81 mg/dL (ref 0.57–1.00)
Globulin, Total: 2.6 g/dL (ref 1.5–4.5)
Glucose: 81 mg/dL (ref 70–99)
Potassium: 4.7 mmol/L (ref 3.5–5.2)
Sodium: 139 mmol/L (ref 134–144)
Total Protein: 7.5 g/dL (ref 6.0–8.5)
eGFR: 86 mL/min/1.73 (ref >59)

## 2024-02-19 LAB — LIPID PANEL
Chol/HDL Ratio: 4.5 ratio — ABNORMAL HIGH (ref 0.0–4.4)
Cholesterol, Total: 220 mg/dL — ABNORMAL HIGH (ref 100–199)
HDL: 49 mg/dL (ref >39)
LDL Chol Calc (NIH): 112 mg/dL — ABNORMAL HIGH (ref 0–99)
Triglycerides: 343 mg/dL — ABNORMAL HIGH (ref 0–149)
VLDL Cholesterol Cal: 59 mg/dL — ABNORMAL HIGH (ref 5–40)

## 2024-02-19 LAB — HEMOGLOBIN A1C
Est. average glucose Bld gHb Est-mCnc: 111 mg/dL
Hgb A1c MFr Bld: 5.5 % (ref 4.8–5.6)

## 2024-02-19 LAB — VITAMIN D 25 HYDROXY (VIT D DEFICIENCY, FRACTURES): Vit D, 25-Hydroxy: 34.6 ng/mL (ref 30.0–100.0)

## 2024-02-19 LAB — TSH: TSH: 0.983 u[IU]/mL (ref 0.450–4.500)

## 2024-02-27 ENCOUNTER — Telehealth: Payer: Self-pay | Admitting: Internal Medicine

## 2024-02-27 NOTE — Telephone Encounter (Signed)
 FMLA forms Copied Noted Sleeved Original placed provider box Copy placed front desk folder

## 2024-05-05 ENCOUNTER — Other Ambulatory Visit: Payer: Self-pay | Admitting: Internal Medicine

## 2024-05-05 DIAGNOSIS — I10 Essential (primary) hypertension: Secondary | ICD-10-CM

## 2024-05-09 ENCOUNTER — Ambulatory Visit

## 2024-08-22 ENCOUNTER — Ambulatory Visit: Admitting: Internal Medicine
